# Patient Record
Sex: Female | Born: 1960 | Race: Black or African American | Hispanic: No | Marital: Married | Smoking: Former smoker
Health system: Southern US, Community
[De-identification: ages and names within clinical notes are randomized; demographics above are authoritative.]

## PROBLEM LIST (undated history)

## (undated) DIAGNOSIS — Q765 Cervical rib: Secondary | ICD-10-CM

## (undated) DIAGNOSIS — N390 Urinary tract infection, site not specified: Secondary | ICD-10-CM

---

## 1998-06-26 ENCOUNTER — Encounter: Admission: RE | Admit: 1998-06-26 | Discharge: 1998-09-24 | Payer: Self-pay | Admitting: Anesthesiology

## 1999-06-24 ENCOUNTER — Emergency Department (HOSPITAL_COMMUNITY): Admission: EM | Admit: 1999-06-24 | Discharge: 1999-06-24 | Payer: Self-pay | Admitting: *Deleted

## 2002-05-02 ENCOUNTER — Emergency Department (HOSPITAL_COMMUNITY): Admission: EM | Admit: 2002-05-02 | Discharge: 2002-05-02 | Payer: Self-pay | Admitting: Emergency Medicine

## 2002-05-02 ENCOUNTER — Encounter: Payer: Self-pay | Admitting: Emergency Medicine

## 2002-11-09 ENCOUNTER — Other Ambulatory Visit: Admission: RE | Admit: 2002-11-09 | Discharge: 2002-11-09 | Payer: Self-pay | Admitting: Obstetrics and Gynecology

## 2003-12-08 ENCOUNTER — Emergency Department (HOSPITAL_COMMUNITY): Admission: EM | Admit: 2003-12-08 | Discharge: 2003-12-08 | Payer: Self-pay | Admitting: Emergency Medicine

## 2004-02-26 ENCOUNTER — Emergency Department (HOSPITAL_COMMUNITY): Admission: EM | Admit: 2004-02-26 | Discharge: 2004-02-26 | Payer: Self-pay | Admitting: Emergency Medicine

## 2005-02-19 ENCOUNTER — Emergency Department (HOSPITAL_COMMUNITY): Admission: EM | Admit: 2005-02-19 | Discharge: 2005-02-19 | Payer: Self-pay | Admitting: Emergency Medicine

## 2005-05-28 ENCOUNTER — Emergency Department (HOSPITAL_COMMUNITY): Admission: EM | Admit: 2005-05-28 | Discharge: 2005-05-28 | Payer: Self-pay | Admitting: Emergency Medicine

## 2005-06-08 ENCOUNTER — Ambulatory Visit: Payer: Self-pay | Admitting: *Deleted

## 2005-06-15 ENCOUNTER — Ambulatory Visit: Payer: Self-pay | Admitting: *Deleted

## 2005-06-22 ENCOUNTER — Ambulatory Visit: Payer: Self-pay | Admitting: *Deleted

## 2005-07-13 ENCOUNTER — Ambulatory Visit: Payer: Self-pay | Admitting: *Deleted

## 2005-07-20 ENCOUNTER — Ambulatory Visit: Payer: Self-pay | Admitting: *Deleted

## 2006-11-27 ENCOUNTER — Emergency Department (HOSPITAL_COMMUNITY): Admission: EM | Admit: 2006-11-27 | Discharge: 2006-11-27 | Payer: Self-pay | Admitting: Emergency Medicine

## 2009-03-16 ENCOUNTER — Emergency Department (HOSPITAL_COMMUNITY): Admission: EM | Admit: 2009-03-16 | Discharge: 2009-03-16 | Payer: Self-pay | Admitting: Emergency Medicine

## 2010-05-17 LAB — DIFFERENTIAL
Basophils Absolute: 0.1 10*3/uL (ref 0.0–0.1)
Basophils Relative: 1 % (ref 0–1)
Eosinophils Absolute: 0.2 10*3/uL (ref 0.0–0.7)
Eosinophils Relative: 2 % (ref 0–5)
Lymphocytes Relative: 21 % (ref 12–46)
Lymphs Abs: 2.4 10*3/uL (ref 0.7–4.0)
Monocytes Relative: 4 % (ref 3–12)
Neutro Abs: 8 10*3/uL — ABNORMAL HIGH (ref 1.7–7.7)
Neutrophils Relative %: 72 % (ref 43–77)

## 2010-05-17 LAB — URINALYSIS, ROUTINE W REFLEX MICROSCOPIC
Glucose, UA: NEGATIVE mg/dL
Hgb urine dipstick: NEGATIVE
Specific Gravity, Urine: 1.021 (ref 1.005–1.030)

## 2010-05-17 LAB — COMPREHENSIVE METABOLIC PANEL
Albumin: 4.5 g/dL (ref 3.5–5.2)
Alkaline Phosphatase: 60 U/L (ref 39–117)
BUN: 9 mg/dL (ref 6–23)
GFR calc Af Amer: >60 mL/min (ref >60)
Glucose, Bld: 84 mg/dL (ref 70–99)
Potassium: 4.1 mEq/L (ref 3.5–5.1)
Sodium: 136 mEq/L (ref 135–145)
Total Protein: 8 g/dL (ref 6.0–8.3)

## 2010-05-17 LAB — WET PREP, GENITAL
Clue Cells Wet Prep HPF POC: NONE SEEN
Trich, Wet Prep: NONE SEEN
Yeast Wet Prep HPF POC: NONE SEEN

## 2010-05-17 LAB — CBC
Hemoglobin: 14.4 g/dL (ref 12.0–15.0)
Platelets: 173 10*3/uL (ref 150–400)
RBC: 5.32 MIL/uL — ABNORMAL HIGH (ref 3.87–5.11)

## 2010-05-17 LAB — GC/CHLAMYDIA PROBE AMP, GENITAL: Chlamydia, DNA Probe: NEGATIVE

## 2010-05-17 LAB — LIPASE, BLOOD: Lipase: 26 U/L (ref 11–59)

## 2010-05-17 LAB — POCT PREGNANCY, URINE: Preg Test, Ur: NEGATIVE

## 2010-12-11 LAB — POCT CARDIAC MARKERS
CKMB, poc: <1 — ABNORMAL LOW
CKMB, poc: <1 — ABNORMAL LOW
Myoglobin, poc: 48.7
Operator id: 3206
Troponin i, poc: <0.05
Troponin i, poc: <0.05

## 2010-12-11 LAB — BASIC METABOLIC PANEL
Calcium: 9.5
GFR calc Af Amer: >60
Sodium: 137

## 2012-01-22 ENCOUNTER — Encounter (HOSPITAL_BASED_OUTPATIENT_CLINIC_OR_DEPARTMENT_OTHER): Payer: Self-pay | Admitting: Emergency Medicine

## 2012-01-22 ENCOUNTER — Emergency Department (HOSPITAL_BASED_OUTPATIENT_CLINIC_OR_DEPARTMENT_OTHER): Payer: Self-pay

## 2012-01-22 ENCOUNTER — Emergency Department (HOSPITAL_BASED_OUTPATIENT_CLINIC_OR_DEPARTMENT_OTHER)
Admission: EM | Admit: 2012-01-22 | Discharge: 2012-01-22 | Disposition: A | Discharge status: Home / Self Care | Payer: Self-pay | Attending: Emergency Medicine | Admitting: Emergency Medicine

## 2012-01-22 DIAGNOSIS — R300 Dysuria: Secondary | ICD-10-CM | POA: Insufficient documentation

## 2012-01-22 DIAGNOSIS — N390 Urinary tract infection, site not specified: Secondary | ICD-10-CM | POA: Insufficient documentation

## 2012-01-22 DIAGNOSIS — D219 Benign neoplasm of connective and other soft tissue, unspecified: Secondary | ICD-10-CM

## 2012-01-22 DIAGNOSIS — R110 Nausea: Secondary | ICD-10-CM | POA: Insufficient documentation

## 2012-01-22 DIAGNOSIS — D259 Leiomyoma of uterus, unspecified: Secondary | ICD-10-CM | POA: Insufficient documentation

## 2012-01-22 LAB — COMPREHENSIVE METABOLIC PANEL
ALT: 10 U/L (ref 0–35)
AST: 15 U/L (ref 0–37)
BUN: 12 mg/dL (ref 6–23)
CO2: 24 mEq/L (ref 19–32)
Calcium: 9.5 mg/dL (ref 8.4–10.5)
GFR calc non Af Amer: 74 mL/min — ABNORMAL LOW (ref >90)
Glucose, Bld: 91 mg/dL (ref 70–99)
Sodium: 139 mEq/L (ref 135–145)
Total Bilirubin: 0.2 mg/dL — ABNORMAL LOW (ref 0.3–1.2)

## 2012-01-22 LAB — CBC WITH DIFFERENTIAL/PLATELET
HCT: 39.3 % (ref 36.0–46.0)
Lymphocytes Relative: 19 % (ref 12–46)
MCH: 26.9 pg (ref 26.0–34.0)
MCHC: 34.6 g/dL (ref 30.0–36.0)
MCV: 77.8 fL — ABNORMAL LOW (ref 78.0–100.0)
Monocytes Absolute: 0.5 10*3/uL (ref 0.1–1.0)
Monocytes Relative: 4 % (ref 3–12)
RBC: 5.05 MIL/uL (ref 3.87–5.11)
RDW: 15.1 % (ref 11.5–15.5)

## 2012-01-22 LAB — URINALYSIS, ROUTINE W REFLEX MICROSCOPIC
Bilirubin Urine: NEGATIVE
Glucose, UA: NEGATIVE mg/dL
Protein, ur: NEGATIVE mg/dL
Specific Gravity, Urine: 1.025 (ref 1.005–1.030)
Urobilinogen, UA: 0.2 mg/dL (ref 0.0–1.0)

## 2012-01-22 LAB — URINE MICROSCOPIC-ADD ON

## 2012-01-22 MED ORDER — SULFAMETHOXAZOLE-TRIMETHOPRIM 800-160 MG PO TABS: 1.0000 | ORAL_TABLET | Freq: Two times a day (BID) | ORAL | Status: DC

## 2012-01-22 MED ORDER — IOHEXOL 300 MG/ML  SOLN
25.0000 mL | INTRAMUSCULAR | Status: AC
  Administered 2012-01-22 (×2): 25 mL via ORAL

## 2012-01-22 MED ORDER — ONDANSETRON HCL 4 MG/2ML IJ SOLN
4.0000 mg | Freq: Once | INTRAMUSCULAR | Status: AC
  Administered 2012-01-22: 4 mg via INTRAVENOUS
  Filled 2012-01-22: qty 2

## 2012-01-22 MED ORDER — MORPHINE SULFATE 4 MG/ML IJ SOLN
4.0000 mg | Freq: Once | INTRAMUSCULAR | Status: AC
  Administered 2012-01-22: 4 mg via INTRAVENOUS
  Filled 2012-01-22: qty 1

## 2012-01-22 MED ORDER — IOHEXOL 300 MG/ML  SOLN
100.0000 mL | Freq: Once | INTRAMUSCULAR | Status: AC | PRN
  Administered 2012-01-22: 100 mL via INTRAVENOUS

## 2012-01-22 NOTE — ED Notes (Signed)
Pt c/o sharp left sided abdominal pain, pressure downwards into pelvic area and radiating around to back, lots of pressure when trying to go to the bathroom, extreme pain; upper chest infection x3weeks, abd pain started x1 week, gradually worse, unrelieved with tylenol, no fever, no n/v/d, sts worse when she eats fried food or orange juice.

## 2012-01-22 NOTE — ED Provider Notes (Signed)
History     CSN: [id number]  Arrival date & time 01/22/12  1201   First MD Initiated Contact with Patient 01/22/12 1222      Chief Complaint  Patient presents with  . Abdominal Pain    (Consider location/radiation/quality/duration/timing/severity/associated sxs/prior treatment) HPI Comments: Pt c/o generalized left sided abdominal pian:pt state that the pain is worse with fried food and orange juice:bm normal:pt states that she is having periods every 2 weeks:no fever:pt states that pain is persistent today and is not getting better  Patient is a 51 y.o. female presenting with abdominal pain. The history is provided by the patient. No language interpreter was used.  Abdominal Pain The primary symptoms of the illness include abdominal pain, nausea and dysuria. The primary symptoms of the illness do not include fever, vomiting, diarrhea or vaginal discharge. The current episode started more than 2 days ago. The onset of the illness was gradual. The problem has been gradually worsening.    No past medical history on file.  No past surgical history on file.  No family history on file.  History  Substance Use Topics  . Smoking status: Not on file  . Smokeless tobacco: Not on file  . Alcohol Use: Not on file    OB History    Grav Para Term Preterm Abortions TAB SAB Ect Mult Living                  Review of Systems  Constitutional: Negative for fever.  Respiratory: Negative.   Cardiovascular: Negative.   Gastrointestinal: Positive for nausea and abdominal pain. Negative for vomiting and diarrhea.  Genitourinary: Positive for dysuria. Negative for vaginal discharge.    Allergies  Penicillins  Home Medications  No current outpatient prescriptions on file.  BP 97/68  Pulse 84  Temp 97.9 F (36.6 C) (Oral)  Resp 18  SpO2 100%  Physical Exam  Nursing note and vitals reviewed. Constitutional: She is oriented to person, place, and time. She appears well-nourished.    HENT:  Head: Normocephalic and atraumatic.  Neck: Neck supple.  Cardiovascular: Normal rate and regular rhythm.   Pulmonary/Chest: Effort normal and breath sounds normal.  Abdominal: Soft. Bowel sounds are normal. There is tenderness.  Musculoskeletal: Normal range of motion.  Neurological: She is alert and oriented to person, place, and time.  Skin: Skin is warm and dry.  Psychiatric: She has a normal mood and affect.    ED Course  Procedures (including critical care time)  Labs Reviewed  URINALYSIS, ROUTINE W REFLEX MICROSCOPIC - Abnormal; Notable for the following:    APPearance CLOUDY (*)     Hgb urine dipstick TRACE (*)     Ketones, ur 15 (*)     Leukocytes, UA LARGE (*)     All other components within normal limits  CBC WITH DIFFERENTIAL - Abnormal; Notable for the following:    WBC 10.6 (*)     MCV 77.8 (*)     Neutro Abs 8.0 (*)     All other components within normal limits  COMPREHENSIVE METABOLIC PANEL - Abnormal; Notable for the following:    Total Bilirubin 0.2 (*)     GFR calc non Af Amer 74 (*)     GFR calc Af Amer 86 (*)     All other components within normal limits  URINE MICROSCOPIC-ADD ON - Abnormal; Notable for the following:    Squamous Epithelial / LPF MANY (*)     Bacteria, UA MANY (*)  All other components within normal limits  PREGNANCY, URINE  URINE CULTURE   Ct Abdomen Pelvis W Contrast  01/22/2012  *RADIOLOGY REPORT*  Clinical Data: Abdominal pain  CT ABDOMEN AND PELVIS WITH CONTRAST  Technique:  Multidetector CT imaging of the abdomen and pelvis was performed following the standard protocol during bolus administration of intravenous contrast.  Contrast: OMNIPAQUE IOHEXOL 300 MG/ML  SOLN  Comparison: None.  Findings: 2.9 cm benign-appearing cyst at the liver dome. There is low density surrounding the portal triads in the liver suggesting periportal edema.  Gallbladder, spleen, pancreas are within normal limits.  Common bile duct is upper  limits of normal in caliber.  There is nodularity of the left adrenal gland without focal mass. Right adrenal gland is unremarkable.  Kidneys are within normal limits.  Contrast and fluid filled loops of small and large bowel are noted throughout the abdomen.  No disproportionate dilatation of small bowel.  The appendix is not clearly defined.  The uterine myometrium is heterogeneous with multiple areas of hyper density most compatible with multiple uterine fibroids.  The endometrium is thickened measuring up to 18 mm.  Small cyst in the left adnexa is noted.  Lumbar degenerative disc disease is present at L4-5 and L5-S1 with posterior disc bulging.  There is significant narrowing of the L5 S1 disc.  No vertebral compression deformity.  IMPRESSION: Marked thickening of the endometrial stripe in multiple myometrial masses most compatible with fibroids.  Further characterization with pelvic ultrasound is recommended.  Benign appearing left adnexal cyst.  Periportal edema in the liver.  Correlate with liver function tests.   Original Report Authenticated By: Jolaine Click, M.D.      1. UTI (lower urinary tract infection)   2. Fibroids       MDM  Pt given results of ct:pt is going [facility]:will treat with antibiotics       Teressa Lower, NP 01/22/12 1455

## 2012-01-23 NOTE — ED Provider Notes (Signed)
Medical screening examination/treatment/procedure(s) were performed by non-physician practitioner and as supervising physician I was immediately available for consultation/collaboration.  Debbi Strandberg, MD 01/23/12 1057 

## 2012-01-24 LAB — URINE CULTURE: Colony Count: >=100000

## 2012-01-25 NOTE — ED Notes (Signed)
+   urine  Patient treated appropriately -sensitive to same-chart appended per protocol MD.  

## 2012-02-16 ENCOUNTER — Telehealth: Payer: Self-pay | Admitting: Obstetrics and Gynecology

## 2012-02-16 NOTE — Telephone Encounter (Signed)
Tc to pt regarding msg.  Pt states that she just started her cycle today and it is very light.  Was seen in the ER in 01/2012 and was recommended by the ER MD to see a GYN MD for her fibroids.  Pt advised it is appropriate for her to keep her appt for tomorrow.  Pt voices agreement.

## 2012-02-17 ENCOUNTER — Encounter: Payer: Self-pay | Admitting: Obstetrics and Gynecology

## 2012-03-16 ENCOUNTER — Encounter: Payer: Self-pay | Admitting: Obstetrics and Gynecology

## 2014-07-11 ENCOUNTER — Other Ambulatory Visit: Payer: Self-pay | Admitting: Family Medicine

## 2014-07-11 DIAGNOSIS — N6321 Unspecified lump in the left breast, upper outer quadrant: Secondary | ICD-10-CM

## 2014-07-12 ENCOUNTER — Ambulatory Visit
Admission: RE | Admit: 2014-07-12 | Discharge: 2014-07-12 | Disposition: A | Discharge status: Home / Self Care | Payer: 59 | Source: Ambulatory Visit | Attending: Family Medicine | Admitting: Family Medicine

## 2014-07-12 DIAGNOSIS — N6321 Unspecified lump in the left breast, upper outer quadrant: Secondary | ICD-10-CM

## 2015-05-15 ENCOUNTER — Emergency Department (HOSPITAL_COMMUNITY)
Admission: EM | Admit: 2015-05-15 | Discharge: 2015-05-16 | Disposition: A | Discharge status: Home / Self Care | Payer: BLUE CROSS/BLUE SHIELD | Attending: Emergency Medicine | Admitting: Emergency Medicine

## 2015-05-15 ENCOUNTER — Emergency Department (HOSPITAL_COMMUNITY): Payer: BLUE CROSS/BLUE SHIELD

## 2015-05-15 ENCOUNTER — Encounter (HOSPITAL_COMMUNITY): Payer: Self-pay | Admitting: Emergency Medicine

## 2015-05-15 DIAGNOSIS — Y92410 Unspecified street and highway as the place of occurrence of the external cause: Secondary | ICD-10-CM | POA: Insufficient documentation

## 2015-05-15 DIAGNOSIS — Z880 Allergy status to penicillin: Secondary | ICD-10-CM | POA: Diagnosis not present

## 2015-05-15 DIAGNOSIS — S8001XA Contusion of right knee, initial encounter: Secondary | ICD-10-CM | POA: Insufficient documentation

## 2015-05-15 DIAGNOSIS — Y998 Other external cause status: Secondary | ICD-10-CM | POA: Insufficient documentation

## 2015-05-15 DIAGNOSIS — Y9389 Activity, other specified: Secondary | ICD-10-CM | POA: Diagnosis not present

## 2015-05-15 DIAGNOSIS — Z79899 Other long term (current) drug therapy: Secondary | ICD-10-CM | POA: Insufficient documentation

## 2015-05-15 DIAGNOSIS — K002 Abnormalities of size and form of teeth: Secondary | ICD-10-CM | POA: Diagnosis not present

## 2015-05-15 DIAGNOSIS — S8991XA Unspecified injury of right lower leg, initial encounter: Secondary | ICD-10-CM | POA: Diagnosis present

## 2015-05-15 MED ORDER — CYCLOBENZAPRINE HCL 10 MG PO TABS
10.0000 mg | ORAL_TABLET | Freq: Once | ORAL | Status: AC
  Administered 2015-05-16: 10 mg via ORAL
  Filled 2015-05-15: qty 1

## 2015-05-15 MED ORDER — CYCLOBENZAPRINE HCL 10 MG PO TABS: 10.0000 mg | ORAL_TABLET | Freq: Two times a day (BID) | ORAL | Status: DC | PRN

## 2015-05-15 MED ORDER — TRAMADOL HCL 50 MG PO TABS
50.0000 mg | ORAL_TABLET | Freq: Once | ORAL | Status: AC
  Administered 2015-05-16: 50 mg via ORAL
  Filled 2015-05-15: qty 1

## 2015-05-15 MED ORDER — IBUPROFEN 800 MG PO TABS: 800.0000 mg | ORAL_TABLET | Freq: Three times a day (TID) | ORAL | Status: DC

## 2015-05-15 MED ORDER — TRAMADOL HCL 50 MG PO TABS: 50.0000 mg | ORAL_TABLET | Freq: Two times a day (BID) | ORAL | Status: DC | PRN

## 2015-05-15 MED ORDER — IBUPROFEN 800 MG PO TABS
800.0000 mg | ORAL_TABLET | Freq: Once | ORAL | Status: AC
  Administered 2015-05-16: 800 mg via ORAL
  Filled 2015-05-15: qty 1
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
## 2015-05-16 DIAGNOSIS — S8001XA Contusion of right knee, initial encounter: Secondary | ICD-10-CM | POA: Diagnosis not present

## 2016-01-26 ENCOUNTER — Encounter (HOSPITAL_COMMUNITY): Payer: Self-pay

## 2016-01-26 ENCOUNTER — Emergency Department (HOSPITAL_COMMUNITY)
Admission: EM | Admit: 2016-01-26 | Discharge: 2016-01-26 | Disposition: A | Discharge status: Home / Self Care | Payer: BLUE CROSS/BLUE SHIELD | Attending: Emergency Medicine | Admitting: Emergency Medicine

## 2016-01-26 DIAGNOSIS — N3000 Acute cystitis without hematuria: Secondary | ICD-10-CM | POA: Insufficient documentation

## 2016-01-26 DIAGNOSIS — F17290 Nicotine dependence, other tobacco product, uncomplicated: Secondary | ICD-10-CM | POA: Insufficient documentation

## 2016-01-26 HISTORY — DX: Urinary tract infection, site not specified: N39.0

## 2016-01-26 LAB — URINALYSIS, ROUTINE W REFLEX MICROSCOPIC
Bilirubin Urine: NEGATIVE
Glucose, UA: NEGATIVE mg/dL
Hgb urine dipstick: NEGATIVE
Ketones, ur: NEGATIVE mg/dL
NITRITE: NEGATIVE
PH: 5.5 (ref 5.0–8.0)
Protein, ur: NEGATIVE mg/dL
SPECIFIC GRAVITY, URINE: 1.019 (ref 1.005–1.030)

## 2016-01-26 LAB — URINE MICROSCOPIC-ADD ON

## 2016-01-26 LAB — POC URINE PREG, ED: PREG TEST UR: NEGATIVE

## 2016-01-26 MED ORDER — CEPHALEXIN 500 MG PO CAPS: 500.0000 mg | ORAL_CAPSULE | Freq: Two times a day (BID) | ORAL | 0 refills | Status: DC

## 2016-01-26 NOTE — ED Triage Notes (Signed)
PT C/O URINARY FREQUENCY, ODOR, AND DYSURIA X1 WEEK. PT DENIES FEVER.

## 2016-01-26 NOTE — ED Provider Notes (Signed)
[facility] DEPT Provider Note   CSN: [id number] Arrival date & time: 01/26/16  2103     History   Chief Complaint Chief Complaint  Patient presents with  . Dysuria    HPI Andrea Snyder is a 55 y.o. female.  Patient presents to the emergency department with chief complaint of dysuria. She states the symptoms started about a week ago. She states that this feels like UTI. She states that she gets a UTI about once every 2 years. She states this feels characteristic of her UTIs. She states that she began drinking a lot of water and cranberry juice, but knew that she had to come and seek treatment when she began to get pain in her low back. She reports associated dysuria, frequency, and urgency. She denies any hematuria. She denies any fevers, chills, nausea, or vomiting. She states that she has taken Keflex and Cipro in the past both with good results. However, she would like to try the Keflex, because she got a yeast infection with the Cipro.    Dysuria      Past Medical History:  Diagnosis Date  . UTI (urinary tract infection)     There are no active problems to display for this patient.   History reviewed. No pertinent surgical history.  OB History    No data available       Home Medications    Prior to Admission medications   Medication Sig Start Date End Date Taking? Authorizing Provider  cyclobenzaprine (FLEXERIL) 10 MG tablet Take 1 tablet (10 mg total) by mouth 2 (two) times daily as needed for muscle spasms. 05/15/15   Delsa Grana, PA-C  ibuprofen (ADVIL,MOTRIN) 800 MG tablet Take 1 tablet (800 mg total) by mouth 3 (three) times daily. 05/15/15   Delsa Grana, PA-C  Specialty Vitamins Products (WEIGHT LOSS DAILY MULTI) TABS Take 1 tablet by mouth 2 (two) times daily.    Historical Provider, MD  sulfamethoxazole-trimethoprim (SEPTRA DS) 800-160 MG per tablet Take 1 tablet by mouth every 12 (twelve) hours. Patient not taking: Reported on 05/15/2015 01/22/12    Glendell Docker, NP  traMADol (ULTRAM) 50 MG tablet Take 1 tablet (50 mg total) by mouth every 12 (twelve) hours as needed for severe pain. 05/15/15   Delsa Grana, PA-C    Family History History reviewed. No pertinent family history.  Social History Social History  Substance Use Topics  . Smoking status: Current Some Day Smoker    Packs/day: 0.50    Types: Cigars  . Smokeless tobacco: Never Used  . Alcohol use Yes     Comment: OCCASIONAL     Allergies   Penicillins and Percocet [oxycodone-acetaminophen]   Review of Systems Review of Systems  Genitourinary: Positive for dysuria.  All other systems reviewed and are negative.    Physical Exam Updated Vital Signs BP 127/55 (BP Location: Right Arm)   Pulse 62   Temp 98.6 F (37 C) (Oral)   Resp 16   Ht 5\' 6"  (1.676 m)   Wt 65.8 kg   LMP 01/11/2012   SpO2 99%   BMI 23.40 kg/m   Physical Exam  Constitutional: She is oriented to person, place, and time. She appears well-developed and well-nourished.  HENT:  Head: Normocephalic and atraumatic.  Eyes: Conjunctivae and EOM are normal.  Neck: Normal range of motion.  Cardiovascular: Normal rate.   Pulmonary/Chest: Effort normal.  Abdominal: She exhibits no distension.  Musculoskeletal: Normal range of motion.  Neurological: She is alert and  oriented to person, place, and time.  Skin: Skin is dry.  Psychiatric: She has a normal mood and affect. Her behavior is normal. Judgment and thought content normal.  Nursing note and vitals reviewed.    ED Treatments / Results  Labs (all labs ordered are listed, but only abnormal results are displayed) Labs Reviewed  URINE CULTURE  URINALYSIS, ROUTINE W REFLEX MICROSCOPIC (NOT AT [facility] [place])  POC URINE PREG, ED    EKG  EKG Interpretation None       Radiology No results found.  Procedures Procedures (including critical care time)  Medications Ordered in ED Medications - No data to display   Initial Impression /  Assessment and Plan / ED Course  I have reviewed the triage vital signs and the nursing notes.  Pertinent labs & imaging results that were available during my care of the patient were reviewed by me and considered in my medical decision making (see chart for details).  Clinical Course     Patient with symptoms consistent with UTI. Will treat with Keflex. Will send urine for culture. Recommend primary care follow-up. Return precautions discussed. Patient understands and agrees the plan. She is stable and ready for discharge.  Final Clinical Impressions(s) / ED Diagnoses   Final diagnoses:  Acute cystitis without hematuria    New Prescriptions New Prescriptions   CEPHALEXIN (KEFLEX) 500 MG CAPSULE    Take 1 capsule (500 mg total) by mouth 2 (two) times daily.     Montine Circle, PA-C 01/26/16 [place], MD 01/27/16 [phone]

## 2016-01-29 LAB — URINE CULTURE

## 2016-01-30 ENCOUNTER — Telehealth (HOSPITAL_BASED_OUTPATIENT_CLINIC_OR_DEPARTMENT_OTHER): Payer: Self-pay | Admitting: Emergency Medicine

## 2016-01-30 NOTE — Telephone Encounter (Signed)
Post ED Visit - Positive Culture Follow-up  Culture report reviewed by antimicrobial stewardship pharmacist:  []  Elenor Quinones, Pharm.D. []  Heide Guile, Pharm.D., BCPS []  Parks Neptune, Pharm.D. []  Alycia Rossetti, Pharm.D., BCPS []  [place], [place].D., BCPS, AAHIVP []  Legrand Como, Pharm.D., BCPS, AAHIVP []  Milus Glazier, Pharm.D. []  Stephens November, Pharm.D. Apryl Ouida Sills PharmD  Positive urine culture Treated with cephalexin, organism sensitive to the same and no further patient follow-up is required at this time.  [name] 01/30/2016, 12:14 PM

## 2017-09-30 ENCOUNTER — Encounter (HOSPITAL_COMMUNITY): Payer: Self-pay | Admitting: Emergency Medicine

## 2017-09-30 ENCOUNTER — Emergency Department (HOSPITAL_COMMUNITY)
Admission: EM | Admit: 2017-09-30 | Discharge: 2017-09-30 | Disposition: A | Discharge status: Home / Self Care | Payer: BLUE CROSS/BLUE SHIELD | Attending: Emergency Medicine | Admitting: Emergency Medicine

## 2017-09-30 DIAGNOSIS — Z79899 Other long term (current) drug therapy: Secondary | ICD-10-CM | POA: Insufficient documentation

## 2017-09-30 DIAGNOSIS — N3001 Acute cystitis with hematuria: Secondary | ICD-10-CM | POA: Insufficient documentation

## 2017-09-30 DIAGNOSIS — F17290 Nicotine dependence, other tobacco product, uncomplicated: Secondary | ICD-10-CM | POA: Insufficient documentation

## 2017-09-30 LAB — URINALYSIS, ROUTINE W REFLEX MICROSCOPIC
Bilirubin Urine: NEGATIVE
GLUCOSE, UA: NEGATIVE mg/dL
Ketones, ur: NEGATIVE mg/dL
Nitrite: POSITIVE — AB
PH: 5 (ref 5.0–8.0)
Protein, ur: NEGATIVE mg/dL
Specific Gravity, Urine: 1.023 (ref 1.005–1.030)
WBC, UA: >50 WBC/hpf — ABNORMAL HIGH (ref 0–5)

## 2017-09-30 LAB — POC URINE PREG, ED: Preg Test, Ur: NEGATIVE

## 2017-09-30 MED ORDER — CEPHALEXIN 500 MG PO CAPS: 500.0000 mg | ORAL_CAPSULE | Freq: Four times a day (QID) | ORAL | 0 refills | Status: DC

## 2017-09-30 NOTE — ED Provider Notes (Signed)
[facility] DEPT Provider Note   CSN: [id number] Arrival date & time: 09/30/17  1832     History   Chief Complaint Chief Complaint  Patient presents with  . Dysuria  . Urinary Frequency    HPI Andrea Snyder is a 57 y.o. female with history of recurrent UTI is here for evaluation of lower abdominal pain since Sunday.  Described as sharp and fullness.  Associated with urinary frequency, darker urine, and bilateral low back pain.  States she is pretty sure that she has another urinary tract infection as all of the symptoms are pretty typical of her UTIs.  She feels more tired but denies fevers, chills, nausea, vomiting, flank pain, dysuria, hematuria, abnormal vaginal discharge, changes in bowel movements.Marland Kitchen  LMP at age 43.  She is not concerned about STDs.  No history of abdominal surgeries.  HPI  Past Medical History:  Diagnosis Date  . UTI (urinary tract infection)     There are no active problems to display for this patient.   History reviewed. No pertinent surgical history.   OB History   None      Home Medications    Prior to Admission medications   Medication Sig Start Date End Date Taking? Authorizing Provider  cephALEXin (KEFLEX) 500 MG capsule Take 1 capsule (500 mg total) by mouth 4 (four) times daily. 09/30/17   Kinnie Feil, PA-C  cyclobenzaprine (FLEXERIL) 10 MG tablet Take 1 tablet (10 mg total) by mouth 2 (two) times daily as needed for muscle spasms. 05/15/15   Delsa Grana, PA-C  ibuprofen (ADVIL,MOTRIN) 800 MG tablet Take 1 tablet (800 mg total) by mouth 3 (three) times daily. 05/15/15   Delsa Grana, PA-C  Specialty Vitamins Products (WEIGHT LOSS DAILY MULTI) TABS Take 1 tablet by mouth 2 (two) times daily.    [provider]  sulfamethoxazole-trimethoprim (SEPTRA DS) 800-160 MG per tablet Take 1 tablet by mouth every 12 (twelve) hours. Patient not taking: Reported on 05/15/2015 01/22/12   Glendell Docker, NP    traMADol (ULTRAM) 50 MG tablet Take 1 tablet (50 mg total) by mouth every 12 (twelve) hours as needed for severe pain. 05/15/15   Delsa Grana, PA-C    Family History No family history on file.  Social History Social History   Tobacco Use  . Smoking status: Current Some Day Smoker    Packs/day: 0.50    Types: Cigars  . Smokeless tobacco: Never Used  Substance Use Topics  . Alcohol use: Yes    Comment: OCCASIONAL  . Drug use: No     Allergies   Penicillins and Percocet [oxycodone-acetaminophen]   Review of Systems Review of Systems  Genitourinary: Positive for pelvic pain and urgency.  Musculoskeletal: Positive for back pain.  All other systems reviewed and are negative.    Physical Exam Updated Vital Signs BP (!) 126/94 (BP Location: Left Arm)   Pulse 77   Temp 98.3 F (36.8 C) (Oral)   Resp 18   LMP 01/11/2012   SpO2 99%   Physical Exam  Constitutional: She is oriented to person, place, and time. She appears well-developed and well-nourished. No distress.  Non toxic  HENT:  Head: Normocephalic and atraumatic.  Nose: Nose normal.  Moist mucous membranes   Eyes: Pupils are equal, round, and reactive to light. Conjunctivae and EOM are normal.  Neck: Normal range of motion.  Cardiovascular: Normal rate, regular rhythm and intact distal pulses.  Pulmonary/Chest: Effort normal and breath sounds  normal.  Abdominal: Soft. Bowel sounds are normal. There is no tenderness.  No G/R/R. No suprapubic or CVA tenderness. Negative Murphy's and McBurney's.  No distention.  Active bowel sounds to lower quadrants.  Musculoskeletal: Normal range of motion.  Neurological: She is alert and oriented to person, place, and time.  Skin: Skin is warm and dry. Capillary refill takes less than 2 seconds.  Psychiatric: She has a normal mood and affect. Her behavior is normal.  Nursing note and vitals reviewed.    ED Treatments / Results  Labs (all labs ordered are listed, but  only abnormal results are displayed) Labs Reviewed  URINALYSIS, ROUTINE W REFLEX MICROSCOPIC - Abnormal; Notable for the following components:      Result Value   APPearance HAZY (*)    Hgb urine dipstick SMALL (*)    Nitrite POSITIVE (*)    Leukocytes, UA MODERATE (*)    WBC, UA >50 (*)    Bacteria, UA MANY (*)    All other components within normal limits  URINE CULTURE  POC URINE PREG, ED    EKG None  Radiology No results found.  Procedures Procedures (including critical care time)  Medications Ordered in ED Medications - No data to display   Initial Impression / Assessment and Plan / ED Course  I have reviewed the triage vital signs and the nursing notes.  Pertinent labs & imaging results that were available during my care of the patient were reviewed by me and considered in my medical decision making (see chart for details).     Urine with positive nitrites, moderate leuks, >50 WBC, many bacteria which fits patient's typical UTI symptoms. abd exam benign as above. No signs of pyelonephritis. No h/o kidney stones. No SIRS criteria met. She is well appearing.  Will dc with keflex, tylenol for associated pain. Discussed return precautions.   Final Clinical Impressions(s) / ED Diagnoses   Final diagnoses:  Acute cystitis with hematuria    ED Discharge Orders        Ordered    cephALEXin (KEFLEX) 500 MG capsule  4 times daily     09/30/17 2102       Kinnie Feil, PA-C 09/30/17 2310    Fredia Sorrow, MD 10/01/17 1710

## 2017-09-30 NOTE — Discharge Instructions (Signed)
Return to the ER for worsening pain, fevers, chills, blood in your urine, flank pain, nausea, vomiting

## 2017-09-30 NOTE — ED Triage Notes (Signed)
Patient c/o dysuria, urinary frequency, lower abdominal pain and lower back pain x6 days. Hx UTI.

## 2017-10-03 LAB — URINE CULTURE: Culture: >=100000 — AB

## 2017-10-04 ENCOUNTER — Telehealth: Payer: Self-pay | Admitting: Emergency Medicine

## 2017-10-04 NOTE — Telephone Encounter (Signed)
Post ED Visit - Positive Culture Follow-up  Culture report reviewed by antimicrobial stewardship pharmacist:  []  Elenor Quinones, Pharm.D. []  Heide Guile, Pharm.D., BCPS AQ-ID []  Parks Neptune, Pharm.D., BCPS []  Alycia Rossetti, Pharm.D., BCPS []  [place], Pharm.D., BCPS, AAHIVP []  Legrand Como, Pharm.D., BCPS, AAHIVP []  Salome Arnt, PharmD, BCPS []  Johnnette Gourd, PharmD, BCPS []  Hughes Better, PharmD, BCPS []  Leeroy Cha, PharmD Jackson Latino PharmD  Positive urine culture Treated with Cephalexin, organism sensitive to the same and no further patient follow-up is required at this time.  [name] 10/04/2017, 10:17 AM

## 2017-11-19 ENCOUNTER — Emergency Department (HOSPITAL_BASED_OUTPATIENT_CLINIC_OR_DEPARTMENT_OTHER)
Admission: EM | Admit: 2017-11-19 | Discharge: 2017-11-19 | Disposition: A | Discharge status: Home / Self Care | Payer: No Typology Code available for payment source | Attending: Emergency Medicine | Admitting: Emergency Medicine

## 2017-11-19 ENCOUNTER — Encounter (HOSPITAL_BASED_OUTPATIENT_CLINIC_OR_DEPARTMENT_OTHER): Payer: Self-pay

## 2017-11-19 ENCOUNTER — Other Ambulatory Visit: Payer: Self-pay

## 2017-11-19 ENCOUNTER — Emergency Department (HOSPITAL_BASED_OUTPATIENT_CLINIC_OR_DEPARTMENT_OTHER): Payer: No Typology Code available for payment source

## 2017-11-19 DIAGNOSIS — Q765 Cervical rib: Secondary | ICD-10-CM | POA: Diagnosis not present

## 2017-11-19 DIAGNOSIS — M79622 Pain in left upper arm: Secondary | ICD-10-CM | POA: Diagnosis not present

## 2017-11-19 DIAGNOSIS — F17210 Nicotine dependence, cigarettes, uncomplicated: Secondary | ICD-10-CM | POA: Diagnosis not present

## 2017-11-19 DIAGNOSIS — M542 Cervicalgia: Secondary | ICD-10-CM | POA: Diagnosis not present

## 2017-11-19 DIAGNOSIS — M546 Pain in thoracic spine: Secondary | ICD-10-CM | POA: Diagnosis not present

## 2017-11-19 HISTORY — DX: Cervical rib: Q76.5

## 2017-11-19 MED ORDER — IBUPROFEN 800 MG PO TABS: 800.0000 mg | ORAL_TABLET | Freq: Three times a day (TID) | ORAL | 0 refills | Status: DC | PRN

## 2017-11-19 MED ORDER — CYCLOBENZAPRINE HCL 10 MG PO TABS: 10.0000 mg | ORAL_TABLET | Freq: Three times a day (TID) | ORAL | 0 refills | Status: DC | PRN

## 2017-11-19 MED ORDER — CYCLOBENZAPRINE HCL 10 MG PO TABS
10.0000 mg | ORAL_TABLET | Freq: Once | ORAL | Status: AC
  Administered 2017-11-19: 10 mg via ORAL
  Filled 2017-11-19: qty 1

## 2017-11-19 MED ORDER — IBUPROFEN 800 MG PO TABS
800.0000 mg | ORAL_TABLET | Freq: Once | ORAL | Status: AC
  Administered 2017-11-19: 800 mg via ORAL
  Filled 2017-11-19: qty 1
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
## 2018-01-19 ENCOUNTER — Emergency Department (HOSPITAL_BASED_OUTPATIENT_CLINIC_OR_DEPARTMENT_OTHER)
Admission: EM | Admit: 2018-01-19 | Discharge: 2018-01-20 | Disposition: A | Discharge status: Home / Self Care | Payer: Self-pay | Attending: Emergency Medicine | Admitting: Emergency Medicine

## 2018-01-19 ENCOUNTER — Encounter (HOSPITAL_BASED_OUTPATIENT_CLINIC_OR_DEPARTMENT_OTHER): Payer: Self-pay | Admitting: *Deleted

## 2018-01-19 ENCOUNTER — Other Ambulatory Visit: Payer: Self-pay

## 2018-01-19 DIAGNOSIS — Z79899 Other long term (current) drug therapy: Secondary | ICD-10-CM | POA: Insufficient documentation

## 2018-01-19 DIAGNOSIS — F17290 Nicotine dependence, other tobacco product, uncomplicated: Secondary | ICD-10-CM | POA: Insufficient documentation

## 2018-01-19 DIAGNOSIS — N760 Acute vaginitis: Secondary | ICD-10-CM | POA: Insufficient documentation

## 2018-01-19 DIAGNOSIS — A599 Trichomoniasis, unspecified: Secondary | ICD-10-CM | POA: Insufficient documentation

## 2018-01-19 DIAGNOSIS — B9689 Other specified bacterial agents as the cause of diseases classified elsewhere: Secondary | ICD-10-CM

## 2018-01-19 LAB — URINALYSIS, MICROSCOPIC (REFLEX)

## 2018-01-19 LAB — URINALYSIS, ROUTINE W REFLEX MICROSCOPIC
Bilirubin Urine: NEGATIVE
GLUCOSE, UA: NEGATIVE mg/dL
HGB URINE DIPSTICK: NEGATIVE
Ketones, ur: NEGATIVE mg/dL
Nitrite: NEGATIVE
PH: 6 (ref 5.0–8.0)
PROTEIN: NEGATIVE mg/dL

## 2018-01-19 LAB — PREGNANCY, URINE: Preg Test, Ur: NEGATIVE

## 2018-01-19 LAB — WET PREP, GENITAL
SPERM: NONE SEEN
YEAST WET PREP: NONE SEEN

## 2018-01-19 NOTE — ED Triage Notes (Signed)
Pt c/o vaginal discharge x 10 days no relief with OTC

## 2018-01-20 MED ORDER — METRONIDAZOLE 500 MG PO TABS: 500.0000 mg | ORAL_TABLET | Freq: Two times a day (BID) | ORAL | 0 refills | Status: DC

## 2018-01-20 MED ORDER — CEFTRIAXONE SODIUM 250 MG IJ SOLR
250.0000 mg | Freq: Once | INTRAMUSCULAR | Status: AC
  Administered 2018-01-20: 250 mg via INTRAMUSCULAR
  Filled 2018-01-20: qty 250

## 2018-01-20 MED ORDER — METRONIDAZOLE 500 MG PO TABS
2000.0000 mg | ORAL_TABLET | Freq: Once | ORAL | Status: AC
  Administered 2018-01-20: 2000 mg via ORAL
  Filled 2018-01-20: qty 4

## 2018-01-20 MED ORDER — AZITHROMYCIN 250 MG PO TABS
1000.0000 mg | ORAL_TABLET | Freq: Once | ORAL | Status: AC
  Administered 2018-01-20: 1000 mg via ORAL
  Filled 2018-01-20: qty 4

## 2018-01-20 NOTE — Discharge Instructions (Addendum)
You have been treated today for trichomonas, gonorrhea and chlamydia.  I recommend close follow-up with your primary care physician for testing for HIV, syphilis, hepatitis as these also can be sexually transmitted diseases.  Your partner will need to be tested and treated as well.  We recommend no sexual activity for at least 1 week after treatment has completed.  Do not take alcohol while taking metronidazole.

## 2018-01-20 NOTE — ED Provider Notes (Signed)
TIME SEEN: 11:35 PM  CHIEF COMPLAINT: Vaginal discharge  HPI: Patient is a 57 year old female with no significant past medical history who presents to the emergency department with over 1 week of thick, white, foul-smelling vaginal discharge.  She states that she tried 3 days of over-the-counter Monistat without relief.  No itching or burning.  Denies fevers, abdominal pain, vomiting, diarrhea, dysuria.  Is sexually active with one female partner and does not use condoms.  No history of STDs.  Last menstrual period was in 2013.  ROS: See HPI Constitutional: no fever  Eyes: no drainage  ENT: no runny nose   Cardiovascular:  no chest pain  Resp: no SOB  GI: no vomiting GU: no dysuria Integumentary: no rash  Allergy: no hives  Musculoskeletal: no leg swelling  Neurological: no slurred speech ROS otherwise negative  PAST MEDICAL HISTORY/PAST SURGICAL HISTORY:  Past Medical History:  Diagnosis Date  . Cervical rib   . UTI (urinary tract infection)     MEDICATIONS:  Prior to Admission medications   Medication Sig Start Date End Date Taking? Authorizing Provider  ibuprofen (ADVIL,MOTRIN) 800 MG tablet Take 1 tablet (800 mg total) by mouth every 8 (eight) hours as needed (for pain). 11/19/17   Molpus, John, MD  Specialty Vitamins Products (WEIGHT LOSS DAILY MULTI) TABS Take 1 tablet by mouth 2 (two) times daily.    [provider]    ALLERGIES:  Allergies  Allergen Reactions  . Penicillins Itching  . Percocet [Oxycodone-Acetaminophen] Itching and Nausea And Vomiting    SOCIAL HISTORY:  Social History   Tobacco Use  . Smoking status: Current Some Day Smoker    Packs/day: 0.50    Types: Cigars  . Smokeless tobacco: Never Used  Substance Use Topics  . Alcohol use: Yes    Comment: OCCASIONAL    FAMILY HISTORY: History reviewed. No pertinent family history.  EXAM: BP 122/73 (BP Location: Right Arm)   Pulse 88   Temp 98.2 F (36.8 C) (Oral)   Resp 16   Ht 5\' 6"   (1.676 m)   Wt 58.3 kg   LMP 01/11/2012   SpO2 100%   BMI 20.74 kg/m  CONSTITUTIONAL: Alert and oriented and responds appropriately to questions. Well-appearing; well-nourished HEAD: Normocephalic EYES: Conjunctivae clear, pupils appear equal, EOMI ENT: normal nose; moist mucous membranes NECK: Supple, no meningismus, no nuchal rigidity, no LAD  CARD: RRR; S1 and S2 appreciated; no murmurs, no clicks, no rubs, no gallops RESP: Normal chest excursion without splinting or tachypnea; breath sounds clear and equal bilaterally; no wheezes, no rhonchi, no rales, no hypoxia or respiratory distress, speaking full sentences ABD/GI: Normal bowel sounds; non-distended; soft, non-tender, no rebound, no guarding, no peritoneal signs, no hepatosplenomegaly GU:  Normal external genitalia. No lesions, rashes noted. Patient has no vaginal bleeding on exam.  Copious amount of thin yellow foul-smelling vaginal discharge.  No adnexal tenderness, mass or fullness, no cervical motion tenderness. Cervix is not appear friable.  Cervix is closed.  Chaperone present for exam. BACK:  The back appears normal and is non-tender to palpation, there is no CVA tenderness EXT: Normal ROM in all joints; non-tender to palpation; no edema; normal capillary refill; no cyanosis, no calf tenderness or swelling    SKIN: Normal color for age and race; warm; no rash NEURO: Moves all extremities equally PSYCH: The patient's mood and manner are appropriate. Grooming and personal hygiene are appropriate.  MEDICAL DECISION MAKING: Patient here with vaginal discharge.  Wet prep positive  for clue cells as well as trichomonas.  No yeast.  Gonorrhea and Chlamydia cultures pending.  Urine does show small leukocytes and many bacteria but likely from her vaginal discharge.  She is not having urinary symptoms and I doubt UTI today.  Discussed with patient that this is a sexually transmitted disease and her partner will need to be tested and treated  as well.  Will give 2 g of oral Flagyl, also treat empirically for gonorrhea and chlamydia today.  Have offered her HIV and syphilis testing which she refuses stating that she will get this done her primary care doctor's office.  Will discharge with Flagyl for the next week for her bacterial vaginosis as well.  At this time, I do not feel there is any life-threatening condition present. I have reviewed and discussed all results (EKG, imaging, lab, urine as appropriate) and exam findings with patient/family. I have reviewed nursing notes and appropriate previous records.  I feel the patient is safe to be discharged home without further emergent workup and can continue workup as an outpatient as needed. Discussed usual and customary return precautions. Patient/family verbalize understanding and are comfortable with this plan.  Outpatient follow-up has been provided if needed. All questions have been answered.       Kevionna Heffler, Delice Bison, DO 01/20/18 [phone]

## 2018-01-21 LAB — GC/CHLAMYDIA PROBE AMP (~~LOC~~) NOT AT ARMC
CHLAMYDIA, DNA PROBE: NEGATIVE
Neisseria Gonorrhea: NEGATIVE

## 2018-10-12 ENCOUNTER — Emergency Department (HOSPITAL_COMMUNITY)
Admission: EM | Admit: 2018-10-12 | Discharge: 2018-10-12 | Disposition: A | Discharge status: Home / Self Care | Payer: No Typology Code available for payment source | Attending: Emergency Medicine | Admitting: Emergency Medicine

## 2018-10-12 ENCOUNTER — Other Ambulatory Visit: Payer: Self-pay

## 2018-10-12 ENCOUNTER — Emergency Department (HOSPITAL_COMMUNITY): Payer: No Typology Code available for payment source

## 2018-10-12 ENCOUNTER — Encounter (HOSPITAL_COMMUNITY): Payer: Self-pay

## 2018-10-12 DIAGNOSIS — S0990XA Unspecified injury of head, initial encounter: Secondary | ICD-10-CM | POA: Diagnosis present

## 2018-10-12 DIAGNOSIS — S060X0A Concussion without loss of consciousness, initial encounter: Secondary | ICD-10-CM | POA: Diagnosis not present

## 2018-10-12 DIAGNOSIS — Y93I9 Activity, other involving external motion: Secondary | ICD-10-CM | POA: Diagnosis not present

## 2018-10-12 DIAGNOSIS — Y999 Unspecified external cause status: Secondary | ICD-10-CM | POA: Insufficient documentation

## 2018-10-12 DIAGNOSIS — Y92481 Parking lot as the place of occurrence of the external cause: Secondary | ICD-10-CM | POA: Insufficient documentation

## 2018-10-12 DIAGNOSIS — F17210 Nicotine dependence, cigarettes, uncomplicated: Secondary | ICD-10-CM | POA: Diagnosis not present

## 2018-10-12 MED ORDER — NAPROXEN 500 MG PO TABS
500.0000 mg | ORAL_TABLET | Freq: Once | ORAL | Status: AC
  Administered 2018-10-12: 500 mg via ORAL
  Filled 2018-10-12: qty 1

## 2018-10-12 MED ORDER — IBUPROFEN 400 MG PO TABS: 400.0000 mg | ORAL_TABLET | Freq: Four times a day (QID) | ORAL | 0 refills | Status: AC | PRN

## 2018-10-12 NOTE — ED Notes (Signed)
Pt called from lobby x1 No response
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
## 2018-10-12 NOTE — Discharge Instructions (Addendum)
Your seen today for headache and dizziness. You may have suffered a mild concussion.   Make sure that you rest and decreased stimulation.  Take ibuprofen as needed for pain or discomfort.  Follow-up with your primary physician if not improving.
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
## 2018-10-12 NOTE — ED Notes (Signed)
Patient transported to CT 

## 2018-11-09 ENCOUNTER — Encounter: Payer: Self-pay | Admitting: Neurology
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
## 2018-12-08 ENCOUNTER — Ambulatory Visit (INDEPENDENT_AMBULATORY_CARE_PROVIDER_SITE_OTHER): Payer: No Typology Code available for payment source | Admitting: Neurology

## 2018-12-08 ENCOUNTER — Encounter: Payer: Self-pay | Admitting: Neurology

## 2018-12-08 ENCOUNTER — Other Ambulatory Visit: Payer: Self-pay

## 2018-12-08 VITALS — BP 128/81 | HR 86 | Temp 97.0°F | Ht 66.5 in | Wt 120.0 lb

## 2018-12-08 DIAGNOSIS — H9312 Tinnitus, left ear: Secondary | ICD-10-CM | POA: Diagnosis not present

## 2018-12-08 DIAGNOSIS — G44309 Post-traumatic headache, unspecified, not intractable: Secondary | ICD-10-CM | POA: Diagnosis not present

## 2018-12-08 DIAGNOSIS — F0781 Postconcussional syndrome: Secondary | ICD-10-CM

## 2018-12-08 MED ORDER — NORTRIPTYLINE HCL 10 MG PO CAPS: 10.0000 mg | ORAL_CAPSULE | Freq: Every day | ORAL | 3 refills | Status: DC

## 2018-12-08 NOTE — Patient Instructions (Signed)
1.  Start nortriptyline 10mg  at bedtime.  If headaches not improved in 4 weeks, then contact me and I will increase dose. 2.  Use ibuprofen as needed but limit use of pain relievers to no more than 2 days out of week to prevent risk of rebound or medication-overuse headache. 3.  Follow up in 4 months.

## 2018-12-09 ENCOUNTER — Encounter: Payer: Self-pay | Admitting: Neurology

## 2019-01-31 ENCOUNTER — Other Ambulatory Visit: Payer: Self-pay

## 2019-01-31 DIAGNOSIS — Z20822 Contact with and (suspected) exposure to covid-19: Secondary | ICD-10-CM

## 2019-02-02 LAB — NOVEL CORONAVIRUS, NAA: SARS-CoV-2, NAA: NOT DETECTED

## 2019-04-14 ENCOUNTER — Ambulatory Visit: Payer: No Typology Code available for payment source | Admitting: Neurology
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
## 2019-04-20 ENCOUNTER — Telehealth: Payer: No Typology Code available for payment source | Admitting: Neurology

## 2019-04-20 ENCOUNTER — Other Ambulatory Visit: Payer: Self-pay

## 2019-06-23 ENCOUNTER — Emergency Department (HOSPITAL_BASED_OUTPATIENT_CLINIC_OR_DEPARTMENT_OTHER): Payer: Self-pay

## 2019-06-23 ENCOUNTER — Encounter (HOSPITAL_BASED_OUTPATIENT_CLINIC_OR_DEPARTMENT_OTHER): Payer: Self-pay

## 2019-06-23 ENCOUNTER — Emergency Department (HOSPITAL_BASED_OUTPATIENT_CLINIC_OR_DEPARTMENT_OTHER)
Admission: EM | Admit: 2019-06-23 | Discharge: 2019-06-23 | Disposition: A | Discharge status: Home / Self Care | Payer: Self-pay | Attending: Emergency Medicine | Admitting: Emergency Medicine

## 2019-06-23 ENCOUNTER — Other Ambulatory Visit: Payer: Self-pay

## 2019-06-23 DIAGNOSIS — R1011 Right upper quadrant pain: Secondary | ICD-10-CM | POA: Insufficient documentation

## 2019-06-23 DIAGNOSIS — F17210 Nicotine dependence, cigarettes, uncomplicated: Secondary | ICD-10-CM | POA: Insufficient documentation

## 2019-06-23 DIAGNOSIS — Z885 Allergy status to narcotic agent status: Secondary | ICD-10-CM | POA: Insufficient documentation

## 2019-06-23 DIAGNOSIS — R10811 Right upper quadrant abdominal tenderness: Secondary | ICD-10-CM

## 2019-06-23 DIAGNOSIS — R1084 Generalized abdominal pain: Secondary | ICD-10-CM | POA: Insufficient documentation

## 2019-06-23 DIAGNOSIS — Z880 Allergy status to penicillin: Secondary | ICD-10-CM | POA: Insufficient documentation

## 2019-06-23 LAB — CBC WITH DIFFERENTIAL/PLATELET
Abs Immature Granulocytes: 0.02 10*3/uL (ref 0.00–0.07)
Basophils Absolute: 0 10*3/uL (ref 0.0–0.1)
Basophils Relative: 0 %
Eosinophils Absolute: 0.3 10*3/uL (ref 0.0–0.5)
Eosinophils Relative: 4 %
HCT: 40.2 % (ref 36.0–46.0)
Hemoglobin: 12.9 g/dL (ref 12.0–15.0)
Immature Granulocytes: 0 %
Lymphocytes Relative: 30 %
Lymphs Abs: 2.3 10*3/uL (ref 0.7–4.0)
MCH: 26.4 pg (ref 26.0–34.0)
MCHC: 32.1 g/dL (ref 30.0–36.0)
MCV: 82.4 fL (ref 80.0–100.0)
Monocytes Absolute: 0.3 10*3/uL (ref 0.1–1.0)
Monocytes Relative: 4 %
Neutro Abs: 4.8 10*3/uL (ref 1.7–7.7)
Neutrophils Relative %: 62 %
Platelets: 166 10*3/uL (ref 150–400)
RBC: 4.88 MIL/uL (ref 3.87–5.11)
RDW: 15.4 % (ref 11.5–15.5)
WBC: 7.8 10*3/uL (ref 4.0–10.5)
nRBC: 0 % (ref 0.0–0.2)

## 2019-06-23 LAB — URINALYSIS, ROUTINE W REFLEX MICROSCOPIC
Bilirubin Urine: NEGATIVE
Glucose, UA: NEGATIVE mg/dL
Hgb urine dipstick: NEGATIVE
Ketones, ur: NEGATIVE mg/dL
Leukocytes,Ua: NEGATIVE
Nitrite: NEGATIVE
Protein, ur: NEGATIVE mg/dL
Specific Gravity, Urine: 1.02 (ref 1.005–1.030)
pH: 6.5 (ref 5.0–8.0)

## 2019-06-23 LAB — COMPREHENSIVE METABOLIC PANEL
ALT: 27 U/L (ref 0–44)
AST: 24 U/L (ref 15–41)
Albumin: 4 g/dL (ref 3.5–5.0)
Alkaline Phosphatase: 69 U/L (ref 38–126)
Anion gap: 8 (ref 5–15)
BUN: 17 mg/dL (ref 6–20)
CO2: 26 mmol/L (ref 22–32)
Calcium: 9 mg/dL (ref 8.9–10.3)
Chloride: 106 mmol/L (ref 98–111)
Creatinine, Ser: 0.79 mg/dL (ref 0.44–1.00)
GFR calc Af Amer: >60 mL/min (ref >60)
GFR calc non Af Amer: >60 mL/min (ref >60)
Glucose, Bld: 98 mg/dL (ref 70–99)
Potassium: 4.5 mmol/L (ref 3.5–5.1)
Sodium: 140 mmol/L (ref 135–145)
Total Bilirubin: 0.4 mg/dL (ref 0.3–1.2)
Total Protein: 7.2 g/dL (ref 6.5–8.1)

## 2019-06-23 MED ORDER — DICYCLOMINE HCL 20 MG PO TABS: 20.0000 mg | ORAL_TABLET | Freq: Two times a day (BID) | ORAL | 0 refills | Status: DC

## 2019-06-23 MED ORDER — DICYCLOMINE HCL 10 MG PO CAPS
10.0000 mg | ORAL_CAPSULE | Freq: Once | ORAL | Status: AC
  Administered 2019-06-23: 12:00:00 10 mg via ORAL
  Filled 2019-06-23: qty 1

## 2019-06-23 MED ORDER — FENTANYL CITRATE (PF) 100 MCG/2ML IJ SOLN
50.0000 ug | Freq: Once | INTRAMUSCULAR | Status: AC
  Administered 2019-06-23: 10:00:00 50 ug via INTRAVENOUS
  Filled 2019-06-23: qty 2

## 2019-06-23 MED ORDER — IOHEXOL 300 MG/ML  SOLN
100.0000 mL | Freq: Once | INTRAMUSCULAR | Status: AC | PRN
  Administered 2019-06-23: 11:00:00 100 mL via INTRAVENOUS

## 2019-06-23 MED FILL — DICYCLOMINE 20 MG TABLET: 20 | 10 days supply | Qty: 20 | Fill #0

## 2019-06-23 NOTE — ED Notes (Signed)
Bladder scan completed result 43ml post void

## 2019-06-23 NOTE — Discharge Instructions (Addendum)
You were seen in the ER for abdominal pain.  You exam did not show any acute causes of your abdominal pain.  I prescribed a medication called Bentyl which should help with your abdominal pain.  Have also provided a referral for a GI doctor if your pain does not improve.  Please call them to schedule an appointment.  Return to the ER if your symptoms worsen.

## 2019-06-23 NOTE — ED Provider Notes (Signed)
[facility] EMERGENCY DEPARTMENT Provider Note   CSN: [id number] Arrival date & time: 06/23/19  0915     History Chief Complaint  Patient presents with  . Abdominal Pain    Andrea Snyder is a 59 y.o. female.  HPI 59 year old female with a history of UTIs presents to the ER for abdominal pain which wraps around her back on both sides.  Triage notes state right lower abdominal pain, however patient states that her pain is mostly right-sided, with occasional left-sided pain.  Pain is aggravated with movement.  Notes patient states that her pain is only relieved with urination, though she states that she has to strain little bit to get her urine going.  She has not had this kind of pain before, states she is overall a healthy person and takes vitamins every day.  She has concerns for kidney stones as her friend suggested that she may have this.  She denies fevers, chills, nausea, vomiting, diarrhea, constipation, chest pain, shortness of breath, weakness, dysuria, hematuria, abnormal vaginal discharge or odors.  Patient states she has not been sexually active for 2 years.  No history of abdominal surgeries.  Last BM was yesterday and normal.     Past Medical History:  Diagnosis Date  . Cervical rib   . UTI (urinary tract infection)     There are no problems to display for this patient.   History reviewed. No pertinent surgical history.   OB History    Gravida  4   Para  3   Term      Preterm      AB  1   Living  3     SAB      TAB      Ectopic      Multiple      Live Births              Family History  Problem Relation Age of Onset  . Diabetes Mother   . Heart failure Mother   . Hypertension Mother   . Cancer Mother   . Diabetes Father   . Heart failure Father   . Hypertension Father   . Stroke Maternal Grandmother     Social History   Tobacco Use  . Smoking status: Current Some Day Smoker    Types: Cigarettes  . Smokeless tobacco:  Never Used  . Tobacco comment: 3 cigarrettes in 24 hour period - trying to quit  Substance Use Topics  . Alcohol use: Yes    Comment: OCCASIONAL  . Drug use: No    Home Medications Prior to Admission medications   Medication Sig Start Date End Date Taking? Authorizing Provider  calcium gluconate 500 MG tablet Take 1 tablet by mouth 3 (three) times daily.    [provider]  dicyclomine (BENTYL) 20 MG tablet Take 1 tablet (20 mg total) by mouth 2 (two) times daily. 06/23/19   Garald Balding, PA-C  ibuprofen (ADVIL) 400 MG tablet Take 1 tablet (400 mg total) by mouth every 6 (six) hours as needed. 10/12/18   Horton, Barbette Hair, MD  Multiple Vitamins-Minerals (MULTIVITAMIN WITH MINERALS) tablet Take 1 tablet by mouth daily.    [provider]  nortriptyline (PAMELOR) 10 MG capsule Take 1 capsule (10 mg total) by mouth at bedtime. 12/08/18   Pieter Partridge, DO  Omega-3 Fatty Acids (FISH OIL PO) Take 1 capsule by mouth daily.    [provider]    Allergies  Penicillins and Percocet [oxycodone-acetaminophen]  Review of Systems   Review of Systems  Constitutional: Negative for chills and fever.  HENT: Negative for ear pain and sore throat.   Eyes: Negative for pain and visual disturbance.  Respiratory: Negative for cough and shortness of breath.   Cardiovascular: Negative for chest pain and palpitations.  Gastrointestinal: Positive for abdominal pain. Negative for diarrhea, nausea and vomiting.  Genitourinary: Positive for difficulty urinating and flank pain. Negative for decreased urine volume, dysuria, hematuria, vaginal bleeding, vaginal discharge and vaginal pain.  Musculoskeletal: Negative for arthralgias, back pain and myalgias.  Skin: Negative for color change and rash.  Neurological: Negative for dizziness, seizures, syncope, weakness, light-headedness and headaches.  Psychiatric/Behavioral: Negative for confusion.  All other systems reviewed and are  negative.   Physical Exam Updated Vital Signs BP 118/74 (BP Location: Right Arm)   Pulse 60   Temp 98.2 F (36.8 C) (Oral)   Resp 16   Ht 5\' 6"  (1.676 m)   Wt 57.6 kg   LMP 01/11/2012   SpO2 99%   BMI 20.50 kg/m   Physical Exam Vitals and nursing note reviewed.  Constitutional:      General: She is not in acute distress.    Appearance: She is well-developed.  HENT:     Head: Normocephalic and atraumatic.     Mouth/Throat:     Mouth: Mucous membranes are moist.     Pharynx: Oropharynx is clear.  Eyes:     Extraocular Movements: Extraocular movements intact.     Conjunctiva/sclera: Conjunctivae normal.     Pupils: Pupils are equal, round, and reactive to light.  Cardiovascular:     Rate and Rhythm: Normal rate and regular rhythm.     Heart sounds: Normal heart sounds. No murmur.  Pulmonary:     Effort: Pulmonary effort is normal. No respiratory distress.     Breath sounds: Normal breath sounds.  Abdominal:     General: Bowel sounds are normal. There is no distension. There are no signs of injury.     Palpations: Abdomen is soft.     Tenderness: There is abdominal tenderness in the right upper quadrant, right lower quadrant and left lower quadrant. There is right CVA tenderness, left CVA tenderness and guarding. There is no rebound. Negative signs include Murphy's sign and McBurney's sign.     Hernia: No hernia is present.     Comments: Significant guarding, CVA tenderness  Musculoskeletal:     Cervical back: Neck supple.  Skin:    General: Skin is warm and dry.  Neurological:     General: No focal deficit present.     Mental Status: She is alert and oriented to person, place, and time.  Psychiatric:        Mood and Affect: Mood normal.        Behavior: Behavior normal.     ED Results / Procedures / Treatments   Labs (all labs ordered are listed, but only abnormal results are displayed) Labs Reviewed  URINALYSIS, ROUTINE W REFLEX MICROSCOPIC  CBC WITH  DIFFERENTIAL/PLATELET  COMPREHENSIVE METABOLIC PANEL    EKG None  Radiology CT ABDOMEN PELVIS W CONTRAST  Result Date: 06/23/2019 CLINICAL DATA:  Acute abdominal pain. Acute onset RIGHT lower quadrant pain. No nausea, vomiting or diarrhea EXAM: CT ABDOMEN AND PELVIS WITH CONTRAST TECHNIQUE: Multidetector CT imaging of the abdomen and pelvis was performed using the standard protocol following bolus administration of intravenous contrast. CONTRAST:  157mL OMNIPAQUE IOHEXOL 300 MG/ML  SOLN COMPARISON:  January 22, 2012 FINDINGS: Lower chest: Lung bases are unremarkable. No consolidation or pleural effusion. Hepatobiliary: RIGHT hepatic cyst. Mildly variable perfusion in the LEFT hepatic lobe potentially relating to underlying steatosis, normalizes on later imaging. Pancreas: Pancreas normal without focal lesion or inflammation. Spleen: Spleen normal size without focal lesion. Adrenals/Urinary Tract: Adrenal glands are normal. Low-density lesions in the RIGHT kidney largest in the interpolar region in the hilar lip posteriorly likely cysts too small for definitive characterization. No hydronephrosis. No visible nephrolithiasis. Ptotic RIGHT kidney, somewhat low lying but similar to previous imaging. Stomach/Bowel: Stomach is under distended limiting assessment. Paucity of intra-abdominal fat limits assessment of individual bowel loops. Some bowel loops in the central abdomen are fluid-filled and mildly dilated. No perienteric stranding. No pericecal stranding. What is seen of the appendix is normal. Passing from lateral to medial in the RIGHT lower quadrant adjacent to small bowel loops Vascular/Lymphatic: Vascular structures in the abdomen are patent. No adenopathy in the upper abdomen or in the retroperitoneum. Calcified and noncalcified atheromatous plaque in the abdominal aorta. Reproductive: Uterus and adnexa not well assessed due to crowding of abdominal structures. Mild heterogeneity of the uterus  potentially related to fibroids. No adnexal mass. Other: No hernia. No ascites. Musculoskeletal: No acute bone finding. No destructive bone process. IMPRESSION: 1. Some bowel loops in the central abdomen are fluid-filled and mildly dilated. No perienteric stranding. Findings could be seen in mild ileus. 2. The entire appendix is not visualized, what is seen is normal. There are no secondary signs of appendicitis. 3. Aortic atherosclerosis. Aortic Atherosclerosis (ICD10-I70.0). Electronically Signed   By: Zetta Bills M.D.   On: 06/23/2019 11:41   US Abdomen Limited RUQ  Result Date: 06/23/2019 CLINICAL DATA:  Worsening right upper quadrant abdominal tenderness since yesterday. EXAM: ULTRASOUND ABDOMEN LIMITED RIGHT UPPER QUADRANT COMPARISON:  Abdomen and pelvis CT obtained earlier today. FINDINGS: Gallbladder: No gallstones or wall thickening visualized. No sonographic Murphy sign noted by sonographer. Common bile duct: Diameter: 2.6 mm Liver: 1.6 x 1.4 x 1.2 cm oval cystic area in the periphery of the right lobe of the liver. This has a mildly to moderately diffusely thickened and mildly irregular surrounding echogenic wall. This has the appearance of a simple cyst on the CT earlier today. On the CT earlier today, this was significantly smaller than on a previous CT dated 01/22/2012, measuring 3.0 cm in maximum diameter at that time. Portal vein is patent on color Doppler imaging with normal direction of blood flow towards the liver. Other: None. IMPRESSION: 1. 1.6 cm cyst with mildly to moderately thickened and mildly irregular echogenic walls in the right lobe of the liver. The wall thickening and increased echogenicity is most likely related to the significant decrease in size of this cyst since 01/22/2012. 2. Otherwise, normal examination. Electronically Signed   By: Claudie Revering M.D.   On: 06/23/2019 14:11    Procedures Procedures (including critical care time)  Medications Ordered in  ED Medications  fentaNYL (SUBLIMAZE) injection 50 mcg (50 mcg Intravenous Given 06/23/19 0959)  iohexol (OMNIPAQUE) 300 MG/ML solution 100 mL (100 mLs Intravenous Contrast Given 06/23/19 1048)  dicyclomine (BENTYL) capsule 10 mg (10 mg Oral Given 06/23/19 1158)    ED Course  I have reviewed the triage vital signs and the nursing notes.  Pertinent labs & imaging results that were available during my care of the patient were reviewed by me and considered in my medical decision making (see chart for details).  Clinical Course as of Jun 22 1537  Fri Jun 23, 2019  1156 CMP without electrolyte abnormalities, renal dysfunction.  UA without evidence of UTI.  CBC without leukocytosis, normal hemoglobin, normal platelets.   [MB]  [id number] CT abdomen impression:  IMPRESSION: 1. Some bowel loops in the central abdomen are fluid-filled and mildly dilated. No perienteric stranding. Findings could be seen in mild ileus. 2. The entire appendix is not visualized, what is seen is normal. There are no secondary signs of appendicitis. 3. Aortic atherosclerosis.  No evidence of appendicitis, renal stones.   [MB]  [id number] Will administer Bentyl and reevaluate   [MB]  [id number] On serial abdominal reevaluation, patient is still tender to palpation most significantly in her right periumbilical area.  Dr. Gilford Raid also examined the patient, noted right upper quadrant pain, will order right upper quadrant ultrasound.   [MB]  1425 RUQ US impression:  IMPRESSION: 1. 1.6 cm cyst with mildly to moderately thickened and mildly irregular echogenic walls in the right lobe of the liver. The wall thickening and increased echogenicity is most likely related to the significant decrease in size of this cyst since 01/22/2012. 2. Otherwise, normal examination.     [MB]    Clinical Course User Index [MB] Lyndel Safe   MDM Rules/Calculators/A&P                     59 year old female with right and left-sided  abdominal pain, but worse on the right with flank pain x2 days. On presentation to the ER, patient nontoxic-appearing, in no acute distress, resting comfortably in the ER bed.  Vitals reassuring.  Physical exam noted below for significant guarding, patient extremely tender to right upper and lower abdomen, and left lower quadrant.  Also notable significant CVA tenderness bilaterally.  Patient jumping up as I palpate her abdomen.  We will start with basic labs, UA, CT abdomen.  CMP without significant electrode normalities, or renal dysfunction.  CBC without leukocytosis, normal platelets.  UA without evidence of UTI.  See ED course for results of CT abdomen/RUQ ultrasound.  On serial abdominal reexamination, patient notes mild improvement of abdominal pain with fentanyl and Bentyl.  Unclear etiology of abdominal pain.  CT negative for acute intra-abdominal pathology.  Right upper ultrasound decreased in size cyst from previous ultrasound done in 2013 patient but otherwise normal.  Doubt constipation, ileus as patient had normal bowel movement yesterday, no nausea/vomiting.  Tolerating p.o. well.  Bladder scan post void with 18 mL, nonconcerning.  Patient is aware of liver cyst from 2013.  Unclear etiology of abdominal pain.  I will DC with Bentyl, close GI follow-up.  Patient voices understanding, is overall reassured by the work-up and is agreeable to this plan.   The patient was seen and evaluated by Dr. Gilford Raid and she is agreeable to the above plan. Final Clinical Impression(s) / ED Diagnoses Final diagnoses:  RUQ abdominal tenderness  Generalized abdominal pain    Rx / DC Orders ED Discharge Orders         Ordered    dicyclomine (BENTYL) 20 MG tablet  2 times daily     06/23/19 1418           Lyndel Safe 06/23/19 1543    Isla Pence, MD 06/25/19 [phone]

## 2019-06-23 NOTE — ED Triage Notes (Signed)
Pt states awoke with pain right lower quadrant, relieved slightly during urination.  Denies fever, denies n/v/d.  Ambulated to bathroom to provide urine specimen with steady gait.

## 2019-06-23 NOTE — ED Notes (Signed)
ED Provider at bedside. 

## 2019-11-25 ENCOUNTER — Other Ambulatory Visit: Payer: Self-pay

## 2019-11-25 ENCOUNTER — Encounter (HOSPITAL_BASED_OUTPATIENT_CLINIC_OR_DEPARTMENT_OTHER): Payer: Self-pay | Admitting: *Deleted

## 2019-11-25 DIAGNOSIS — N390 Urinary tract infection, site not specified: Secondary | ICD-10-CM | POA: Insufficient documentation

## 2019-11-25 DIAGNOSIS — F17290 Nicotine dependence, other tobacco product, uncomplicated: Secondary | ICD-10-CM | POA: Insufficient documentation

## 2019-11-25 NOTE — ED Triage Notes (Signed)
Pt reports left flank/LLQ/central abd pain since Thursday. "burning pain". States she had similar pain when she had covid in July. Also states she has not been sexually active x 1 year

## 2019-11-26 ENCOUNTER — Encounter (HOSPITAL_BASED_OUTPATIENT_CLINIC_OR_DEPARTMENT_OTHER): Payer: Self-pay | Admitting: Emergency Medicine

## 2019-11-26 ENCOUNTER — Emergency Department (HOSPITAL_BASED_OUTPATIENT_CLINIC_OR_DEPARTMENT_OTHER)
Admission: EM | Admit: 2019-11-26 | Discharge: 2019-11-26 | Disposition: A | Discharge status: Home / Self Care | Payer: Self-pay | Attending: Emergency Medicine | Admitting: Emergency Medicine

## 2019-11-26 ENCOUNTER — Telehealth (HOSPITAL_BASED_OUTPATIENT_CLINIC_OR_DEPARTMENT_OTHER): Payer: Self-pay | Admitting: Emergency Medicine

## 2019-11-26 DIAGNOSIS — N390 Urinary tract infection, site not specified: Secondary | ICD-10-CM

## 2019-11-26 LAB — URINALYSIS, MICROSCOPIC (REFLEX)

## 2019-11-26 LAB — URINALYSIS, ROUTINE W REFLEX MICROSCOPIC
Bilirubin Urine: NEGATIVE
Glucose, UA: NEGATIVE mg/dL
Ketones, ur: NEGATIVE mg/dL
Leukocytes,Ua: NEGATIVE
Nitrite: POSITIVE — AB
Protein, ur: NEGATIVE mg/dL
Specific Gravity, Urine: 1.025 (ref 1.005–1.030)
pH: 6 (ref 5.0–8.0)

## 2019-11-26 MED ORDER — SULFAMETHOXAZOLE-TRIMETHOPRIM 800-160 MG PO TABS: 1.0000 | ORAL_TABLET | Freq: Two times a day (BID) | ORAL | 0 refills | Status: AC

## 2019-11-26 MED ORDER — SULFAMETHOXAZOLE-TRIMETHOPRIM 800-160 MG PO TABS
1.0000 | ORAL_TABLET | Freq: Once | ORAL | Status: AC
  Administered 2019-11-26: 1 via ORAL
  Filled 2019-11-26: qty 1

## 2019-11-26 MED ORDER — PHENAZOPYRIDINE HCL 200 MG PO TABS: 200.0000 mg | ORAL_TABLET | Freq: Three times a day (TID) | ORAL | 0 refills | Status: DC | PRN

## 2019-11-26 NOTE — ED Provider Notes (Signed)
[facility] EMERGENCY DEPARTMENT Provider Note   CSN: [id number] Arrival date & time: 11/25/19  2304     History Chief Complaint  Patient presents with   Abdominal Pain    Andrea Snyder is a 59 y.o. female.  The history is provided by the patient.  Abdominal Pain Pain location:  L flank Pain quality: not burning   Pain radiates to:  Does not radiate Pain severity:  Moderate Onset quality:  Gradual Timing:  Constant Progression:  Unchanged Chronicity:  Recurrent Context: not awakening from sleep, not eating and not trauma   Relieved by:  Nothing Worsened by:  Nothing Ineffective treatments:  None tried Associated symptoms: dysuria   Associated symptoms: no anorexia, no belching, no chest pain, no chills, no constipation, no cough, no diarrhea, no fatigue, no fever, no flatus, no hematemesis, no hematochezia, no hematuria, no melena, no nausea, no shortness of breath, no sore throat, no vaginal bleeding, no vaginal discharge and no vomiting   Risk factors: no alcohol abuse        Past Medical History:  Diagnosis Date   Cervical rib    UTI (urinary tract infection)     There are no problems to display for this patient.   History reviewed. No pertinent surgical history.   OB History    Gravida  4   Para  3   Term      Preterm      AB  1   Living  3     SAB      TAB      Ectopic      Multiple      Live Births              Family History  Problem Relation Age of Onset   Diabetes Mother    Heart failure Mother    Hypertension Mother    Cancer Mother    Diabetes Father    Heart failure Father    Hypertension Father    Stroke Maternal Grandmother     Social History   Tobacco Use   Smoking status: Current Some Day Smoker    Types: Cigars   Smokeless tobacco: Never Used   Tobacco comment: 3 cigarrettes in 24 hour period - trying to quit  Vaping Use   Vaping Use: Never used  Substance Use Topics    Alcohol use: Not Currently    Comment: OCCASIONAL   Drug use: No    Home Medications Prior to Admission medications   Medication Sig Start Date End Date Taking? Authorizing Provider  calcium gluconate 500 MG tablet Take 1 tablet by mouth 3 (three) times daily.    [provider]  dicyclomine (BENTYL) 20 MG tablet Take 1 tablet (20 mg total) by mouth 2 (two) times daily. 06/23/19   Garald Balding, PA-C  ibuprofen (ADVIL) 400 MG tablet Take 1 tablet (400 mg total) by mouth every 6 (six) hours as needed. 10/12/18   Horton, Barbette Hair, MD  Multiple Vitamins-Minerals (MULTIVITAMIN WITH MINERALS) tablet Take 1 tablet by mouth daily.    [provider]  nortriptyline (PAMELOR) 10 MG capsule Take 1 capsule (10 mg total) by mouth at bedtime. 12/08/18   Pieter Partridge, DO  Omega-3 Fatty Acids (FISH OIL PO) Take 1 capsule by mouth daily.    [provider]    Allergies    Penicillins and Percocet [oxycodone-acetaminophen]  Review of Systems   Review of Systems  Constitutional:  Negative for chills, fatigue and fever.  HENT: Negative for sore throat.   Eyes: Negative for visual disturbance.  Respiratory: Negative for cough and shortness of breath.   Cardiovascular: Negative for chest pain.  Gastrointestinal: Positive for abdominal pain. Negative for anorexia, constipation, diarrhea, flatus, hematemesis, hematochezia, melena, nausea and vomiting.  Endocrine: Negative for polyuria.  Genitourinary: Positive for dysuria. Negative for hematuria, vaginal bleeding and vaginal discharge.  Musculoskeletal: Negative for arthralgias.  Skin: Negative for color change.  Neurological: Negative for dizziness.  Psychiatric/Behavioral: Negative for agitation.  All other systems reviewed and are negative.   Physical Exam Updated Vital Signs BP 126/81 (BP Location: Right Arm)    Pulse 78    Temp 98.3 F (36.8 C) (Oral)    Resp 16    Ht 5' 6.5" (1.689 m)    Wt 59 kg    LMP 01/11/2012     SpO2 97%    BMI 20.67 kg/m   Physical Exam Vitals and nursing note reviewed.  Constitutional:      General: She is not in acute distress.    Appearance: Normal appearance.  HENT:     Head: Normocephalic and atraumatic.     Nose: Nose normal.  Eyes:     Conjunctiva/sclera: Conjunctivae normal.     Pupils: Pupils are equal, round, and reactive to light.  Cardiovascular:     Rate and Rhythm: Normal rate and regular rhythm.     Pulses: Normal pulses.     Heart sounds: Normal heart sounds.  Pulmonary:     Effort: Pulmonary effort is normal.     Breath sounds: Normal breath sounds.  Abdominal:     General: Abdomen is flat.     Palpations: Abdomen is soft.     Tenderness: There is no abdominal tenderness. There is no guarding or rebound.     Comments: Gassy   Musculoskeletal:        General: Normal range of motion.     Cervical back: Normal range of motion and neck supple.  Skin:    General: Skin is warm and dry.     Capillary Refill: Capillary refill takes less than 2 seconds.  Neurological:     General: No focal deficit present.     Mental Status: She is alert and oriented to person, place, and time.     Deep Tendon Reflexes: Reflexes normal.  Psychiatric:        Mood and Affect: Mood normal.        Behavior: Behavior normal.     ED Results / Procedures / Treatments   Labs (all labs ordered are listed, but only abnormal results are displayed) Results for orders placed or performed during the hospital encounter of 11/26/19  Urinalysis, Routine w reflex microscopic Urine, Clean Catch  Result Value Ref Range   Color, Urine YELLOW YELLOW   APPearance HAZY (A) CLEAR   Specific Gravity, Urine 1.025 1.005 - 1.030   pH 6.0 5.0 - 8.0   Glucose, UA NEGATIVE NEGATIVE mg/dL   Hgb urine dipstick TRACE (A) NEGATIVE   Bilirubin Urine NEGATIVE NEGATIVE   Ketones, ur NEGATIVE NEGATIVE mg/dL   Protein, ur NEGATIVE NEGATIVE mg/dL   Nitrite POSITIVE (A) NEGATIVE   Leukocytes,Ua  NEGATIVE NEGATIVE  Urinalysis, Microscopic (reflex)  Result Value Ref Range   RBC / HPF 0-5 0 - 5 RBC/hpf   WBC, UA 0-5 0 - 5 WBC/hpf   Bacteria, UA MANY (A) NONE SEEN   Squamous Epithelial / LPF  0-5 0 - 5   No results found.  Radiology No results found.  Procedures Procedures (including critical care time)  Medications Ordered in ED Medications  sulfamethoxazole-trimethoprim (BACTRIM DS) 800-160 MG per tablet 1 tablet (has no administration in time range)    ED Course  I have reviewed the triage vital signs and the nursing notes.  Pertinent labs & imaging results that were available during my care of the patient were reviewed by me and considered in my medical decision making (see chart for details).    Patient has a UTI.  Will treat with bactrim given her PCN allergy.  I do not believe this is a stone.  I do not believe this is a surgical cause of pain. No signs of sepsis, well appearing.  Patient PO challenged in the ED.   I will treat for 10 days.  Follow up with your PMD for recheck.     Andrea Snyder was evaluated in Emergency Department on 11/26/2019 for the symptoms described in the history of present illness. She was evaluated in the context of the global COVID-19 pandemic, which necessitated consideration that the patient might be at risk for infection with the SARS-CoV-2 virus that causes COVID-19. Institutional protocols and algorithms that pertain to the evaluation of patients at risk for COVID-19 are in a state of rapid change based on information released by regulatory bodies including the CDC and federal and state organizations. These policies and algorithms were followed during the patient's care in the ED.  Final Clinical Impression(s) / ED Diagnoses Return for intractable cough, coughing up blood,fevers >100.4 unrelieved by medication, shortness of breath, intractable vomiting, chest pain, shortness of breath, weakness,numbness, changes in speech, facial  asymmetry,abdominal pain, passing out,Inability to tolerate liquids or food, cough, altered mental status or any concerns. No signs of systemic illness or infection. The patient is nontoxic-appearing on exam and vital signs are within normal limits.   I have reviewed the triage vital signs and the nursing notes. Pertinent labs &imaging results that were available during my care of the patient were reviewed by me and considered in my medical decision making (see chart for details).After history, exam, and medical workup I feel the patient has beenappropriately medically screened and is safe for discharge home. Pertinent diagnoses were discussed with the patient. Patient was given return precautions.    Keats Kingry, MD 11/26/19 [phone]

## 2020-11-17 ENCOUNTER — Emergency Department (HOSPITAL_BASED_OUTPATIENT_CLINIC_OR_DEPARTMENT_OTHER)
Admission: EM | Admit: 2020-11-17 | Discharge: 2020-11-18 | Disposition: A | Discharge status: Home / Self Care | Payer: Self-pay | Attending: Emergency Medicine | Admitting: Emergency Medicine

## 2020-11-17 ENCOUNTER — Emergency Department (HOSPITAL_BASED_OUTPATIENT_CLINIC_OR_DEPARTMENT_OTHER): Payer: Self-pay

## 2020-11-17 ENCOUNTER — Encounter (HOSPITAL_BASED_OUTPATIENT_CLINIC_OR_DEPARTMENT_OTHER): Payer: Self-pay | Admitting: *Deleted

## 2020-11-17 ENCOUNTER — Other Ambulatory Visit: Payer: Self-pay

## 2020-11-17 DIAGNOSIS — N3000 Acute cystitis without hematuria: Secondary | ICD-10-CM | POA: Insufficient documentation

## 2020-11-17 DIAGNOSIS — R202 Paresthesia of skin: Secondary | ICD-10-CM | POA: Insufficient documentation

## 2020-11-17 DIAGNOSIS — R531 Weakness: Secondary | ICD-10-CM | POA: Insufficient documentation

## 2020-11-17 DIAGNOSIS — R600 Localized edema: Secondary | ICD-10-CM | POA: Insufficient documentation

## 2020-11-17 DIAGNOSIS — Z87891 Personal history of nicotine dependence: Secondary | ICD-10-CM | POA: Insufficient documentation

## 2020-11-17 LAB — CBC WITH DIFFERENTIAL/PLATELET
Abs Immature Granulocytes: 0.01 10*3/uL (ref 0.00–0.07)
Basophils Absolute: 0.1 10*3/uL (ref 0.0–0.1)
Basophils Relative: 1 %
Eosinophils Absolute: 0.3 10*3/uL (ref 0.0–0.5)
Eosinophils Relative: 4 %
HCT: 36.8 % (ref 36.0–46.0)
Hemoglobin: 11.9 g/dL — ABNORMAL LOW (ref 12.0–15.0)
Immature Granulocytes: 0 %
Lymphocytes Relative: 42 %
Lymphs Abs: 3.1 10*3/uL (ref 0.7–4.0)
MCH: 26 pg (ref 26.0–34.0)
MCHC: 32.3 g/dL (ref 30.0–36.0)
MCV: 80.3 fL (ref 80.0–100.0)
Monocytes Absolute: 0.3 10*3/uL (ref 0.1–1.0)
Monocytes Relative: 4 %
Neutro Abs: 3.6 10*3/uL (ref 1.7–7.7)
Neutrophils Relative %: 49 %
Platelets: 217 10*3/uL (ref 150–400)
RBC: 4.58 MIL/uL (ref 3.87–5.11)
RDW: 15.9 % — ABNORMAL HIGH (ref 11.5–15.5)
WBC: 7.4 10*3/uL (ref 4.0–10.5)
nRBC: 0 % (ref 0.0–0.2)

## 2020-11-17 LAB — URINALYSIS, ROUTINE W REFLEX MICROSCOPIC
Bilirubin Urine: NEGATIVE
Glucose, UA: NEGATIVE mg/dL
Hgb urine dipstick: NEGATIVE
Ketones, ur: NEGATIVE mg/dL
Leukocytes,Ua: NEGATIVE
Nitrite: POSITIVE — AB
Protein, ur: NEGATIVE mg/dL
Specific Gravity, Urine: 1.03 (ref 1.005–1.030)
pH: 6 (ref 5.0–8.0)

## 2020-11-17 LAB — COMPREHENSIVE METABOLIC PANEL
ALT: 18 U/L (ref 0–44)
AST: 19 U/L (ref 15–41)
Albumin: 4.1 g/dL (ref 3.5–5.0)
Alkaline Phosphatase: 64 U/L (ref 38–126)
Anion gap: 8 (ref 5–15)
BUN: 16 mg/dL (ref 6–20)
CO2: 27 mmol/L (ref 22–32)
Calcium: 9.4 mg/dL (ref 8.9–10.3)
Chloride: 104 mmol/L (ref 98–111)
Creatinine, Ser: 0.86 mg/dL (ref 0.44–1.00)
GFR, Estimated: >60 mL/min (ref >60)
Glucose, Bld: 86 mg/dL (ref 70–99)
Potassium: 3.5 mmol/L (ref 3.5–5.1)
Sodium: 139 mmol/L (ref 135–145)
Total Bilirubin: 0.2 mg/dL — ABNORMAL LOW (ref 0.3–1.2)
Total Protein: 7.5 g/dL (ref 6.5–8.1)

## 2020-11-17 LAB — URINALYSIS, MICROSCOPIC (REFLEX)

## 2020-11-17 LAB — TROPONIN I (HIGH SENSITIVITY): Troponin I (High Sensitivity): <2 ng/L (ref <18)

## 2020-11-17 MED ORDER — LACTATED RINGERS IV BOLUS
1000.0000 mL | Freq: Once | INTRAVENOUS | Status: AC
  Administered 2020-11-17: 1000 mL via INTRAVENOUS

## 2020-11-17 NOTE — ED Triage Notes (Signed)
Pt reports difficulty walking today in grocery store this evening (points to left leg)- states she is having  numbness in her legs and arms and everything "feels tight" in her body. States she has had 2 other episodes like this in the last few months. Reports tightness in chest and extremities

## 2020-11-18 MED ORDER — NITROFURANTOIN MONOHYD MACRO 100 MG PO CAPS: 100.0000 mg | ORAL_CAPSULE | Freq: Two times a day (BID) | ORAL | 0 refills | Status: DC

## 2020-11-18 NOTE — ED Provider Notes (Signed)
[facility] Provider Note   CSN: [id number] Arrival date & time: 11/17/20  2010     History Chief Complaint  Patient presents with   Numbness    Andrea Snyder is a 60 y.o. female.  Patient is a 60 year old female with no known medical problems who is presenting today with multiple complaints.  Patient reports for the last several months she has had burning tingling pain in bilateral hands and feet, intermittent fatigue and just not feeling herself.  In the last few days she has noticed that all the symptoms have been getting worse and her body just feels tight all over.  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  She was taking ibuprofen as needed for some aches and pains and trying to get plenty of rest.  Today she was at the grocery store with her daughter when she just started feeling worse and it felt like her legs would not hold her up.  She had to sit down and rest because she felt so generally weak but it did feel little bit worse on the left side.  She had no speech problems or visual changes.  Denies any headache.  She has not had cough, congestion or significant shortness of breath.  She feels tightness throughout her chest, abdomen, back, arms and legs.  She has noticed some swelling in her right knee that is new but denies any pain or injury.  She takes no prescription medications but does take vitamins over-the-counter.  She eats a normal diet.  She feels like symptoms may have all started after she had her second round of COVID in July.  She no longer uses tobacco and is not on any oral contraceptives and denies any recent immobilization, calf pain or swelling.  The history is provided by the patient.      Past Medical History:  Diagnosis Date   Cervical rib    UTI  (urinary tract infection)     There are no problems to display for this patient.   History reviewed. No pertinent surgical history.   OB History     Gravida  4   Para  3   Term      Preterm      AB  1   Living  3      SAB      IAB      Ectopic      Multiple      Live Births              Family History  Problem Relation Age of Onset   Diabetes Mother    Heart failure Mother    Hypertension Mother    Cancer Mother    Diabetes Father    Heart failure Father    Hypertension Father    Stroke Maternal Grandmother     Social History   Tobacco Use   Smoking status: Former    Types: Cigars   Smokeless tobacco: Never   Tobacco comments:    3 cigarrettes in 24 hour period - trying to quit  Vaping Use   Vaping Use: Never used  Substance Use Topics   Alcohol use: Not Currently    Comment: OCCASIONAL   Drug use: No    Home  Medications Prior to Admission medications   Medication Sig Start Date End Date Taking? Authorizing Provider  nitrofurantoin, macrocrystal-monohydrate, (MACROBID) 100 MG capsule Take 1 capsule (100 mg total) by mouth 2 (two) times daily. 11/18/20  Yes Blanchie Dessert, MD  calcium gluconate 500 MG tablet Take 1 tablet by mouth 3 (three) times daily.    [provider]  dicyclomine (BENTYL) 20 MG tablet Take 1 tablet (20 mg total) by mouth 2 (two) times daily. 06/23/19   Garald Balding, PA-C  ibuprofen (ADVIL) 400 MG tablet Take 1 tablet (400 mg total) by mouth every 6 (six) hours as needed. 10/12/18   Horton, Barbette Hair, MD  Multiple Vitamins-Minerals (MULTIVITAMIN WITH MINERALS) tablet Take 1 tablet by mouth daily.    [provider]  nortriptyline (PAMELOR) 10 MG capsule Take 1 capsule (10 mg total) by mouth at bedtime. 12/08/18   Pieter Partridge, DO  Omega-3 Fatty Acids (FISH OIL PO) Take 1 capsule by mouth daily.    [provider]  phenazopyridine (PYRIDIUM) 200 MG tablet Take 1 tablet (200 mg total) by  mouth 3 (three) times daily as needed for pain. 11/26/19   Palumbo, April, MD    Allergies    Penicillins and Percocet [oxycodone-acetaminophen]  Review of Systems   Review of Systems  All other systems reviewed and are negative.  Physical Exam Updated Vital Signs BP 138/77   Pulse (!) 57   Temp 98.5 F (36.9 C) (Oral)   Resp 14   Ht 5' 6.5" (1.689 m)   Wt 62.1 kg   LMP 01/11/2012   SpO2 100%   BMI 21.78 kg/m   Physical Exam Vitals and nursing note reviewed.  Constitutional:      General: She is not in acute distress.    Appearance: She is well-developed.  HENT:     Head: Normocephalic and atraumatic.     Nose: Nose normal.     Mouth/Throat:     Mouth: Mucous membranes are moist.  Eyes:     General: No visual field deficit.    Pupils: Pupils are equal, round, and reactive to light.  Cardiovascular:     Rate and Rhythm: Normal rate and regular rhythm.     Pulses: Normal pulses.     Heart sounds: Normal heart sounds. No murmur heard.   No friction rub.  Pulmonary:     Effort: Pulmonary effort is normal.     Breath sounds: Normal breath sounds. No wheezing or rales.  Abdominal:     General: Bowel sounds are normal. There is no distension.     Palpations: Abdomen is soft.     Tenderness: There is no abdominal tenderness. There is no guarding or rebound.  Musculoskeletal:        General: No tenderness. Normal range of motion.     Right lower leg: No edema.     Left lower leg: No edema.     Comments: Marland Kitchen  Mild swelling noted to the right knee but full rom and no tenderness. No edema  Skin:    General: Skin is warm and dry.     Findings: No rash.  Neurological:     Mental Status: She is alert and oriented to person, place, and time.     Cranial Nerves: No cranial nerve deficit or dysarthria.     Sensory: No sensory deficit.     Motor: No pronator drift.     Coordination: Coordination is intact.     Comments:  When she walks slightly more board based and slaps her  feet a little while walking.  She has 5/5 strength in bilateral upper and lower ext without pronator drift but appears to take more effort than one would expect.  Sensation intact.  Psychiatric:        Mood and Affect: Mood normal.        Behavior: Behavior normal.    ED Results / Procedures / Treatments   Labs (all labs ordered are listed, but only abnormal results are displayed) Labs Reviewed  URINALYSIS, ROUTINE W REFLEX MICROSCOPIC - Abnormal; Notable for the following components:      Result Value   Nitrite POSITIVE (*)    All other components within normal limits  CBC WITH DIFFERENTIAL/PLATELET - Abnormal; Notable for the following components:   Hemoglobin 11.9 (*)    RDW 15.9 (*)    All other components within normal limits  COMPREHENSIVE METABOLIC PANEL - Abnormal; Notable for the following components:   Total Bilirubin 0.2 (*)    All other components within normal limits  URINALYSIS, MICROSCOPIC (REFLEX) - Abnormal; Notable for the following components:   Bacteria, UA MANY (*)    All other components within normal limits  TROPONIN I (HIGH SENSITIVITY)    EKG EKG Interpretation  Date/Time:  Sunday November 17 2020 20:23:17 EDT Ventricular Rate:  68 PR Interval:  166 QRS Duration: 88 QT Interval:  384 QTC Calculation: 409 R Axis:   72 Text Interpretation: Sinus rhythm Consider left ventricular hypertrophy No previous tracing Confirmed by Blanchie Dessert [phone]) on 11/17/2020 10:02:46 PM  Radiology DG Chest 2 View  Result Date: 11/17/2020 CLINICAL DATA:  chest tightness. EXAM: CHEST - 2 VIEW COMPARISON:  11/27/2006 FINDINGS: The heart size and mediastinal contours are within normal limits. Both lungs are clear. The visualized skeletal structures are unremarkable. IMPRESSION: No active cardiopulmonary disease. Electronically Signed   By: Fidela Salisbury M.D.   On: 11/17/2020 21:29   CT HEAD WO CONTRAST (5MM)  Result Date: 11/17/2020 CLINICAL DATA:  Neuro deficit,  acute, stroke suspected. Gait abnormality EXAM: CT HEAD WITHOUT CONTRAST TECHNIQUE: Contiguous axial images were obtained from the base of the skull through the vertex without intravenous contrast. COMPARISON:  10/12/2018 FINDINGS: Brain: Normal anatomic configuration. No abnormal intra or extra-axial mass lesion or fluid collection. No abnormal mass effect or midline shift. No evidence of acute intracranial hemorrhage or infarct. Ventricular size is normal. Cerebellum unremarkable. Vascular: Unremarkable Skull: Intact Sinuses/Orbits: Paranasal sinuses are clear. Orbits are unremarkable. Other: Mastoid air cells and middle ear cavities are clear. IMPRESSION: No acute intracranial abnormality.  Unremarkable examination. Electronically Signed   By: Fidela Salisbury M.D.   On: 11/17/2020 21:32    Procedures Procedures   Medications Ordered in ED Medications  lactated ringers bolus 1,000 mL ( Intravenous Stopped 11/17/20 2227)    ED Course  I have reviewed the triage vital signs and the nursing notes.  Pertinent labs & imaging results that were available during my care of the patient were reviewed by me and considered in my medical decision making (see chart for details).    MDM Rules/Calculators/A&P                           Patient is a 60 year old female presenting today with multiple complaints of paresthesias in her hands and feet, thigh pain and burning and feeling weak in her lower extremities with activity that has been gradually persistent for  the last few months but worse in the last few days.  She has also been complaining of foul-smelling urine and urinary frequency in the last few days as well as some back pain.  Symptoms seem to be more global and not unilateral.  This patient's exam findings are not consistent with stroke today.  No evidence of diabetes, acute cardiac etiology or electrolyte abnormalities.  Patient's labs including CBC, CMP, troponin, EKG and chest x-ray are all within  normal limits.  Does have findings concerning for possible UTI with positive nitrites and many bacteria.  Given she is complaining of urinary symptoms and has a history of UTIs in the past we will treat with Macrobid.  However patient's symptoms of paresthesias are concerning for possible peripheral neuropathy.  Patient does work around a lot of Sports [employer] and does work in some very old building so concern for possible heavy metals like lead versus other sources for neuropathy.  Discussed with patient's importance of following up for further testing.  Patient is comfortable with this plan and she was discharged home.  MDM   Amount and/or Complexity of Data Reviewed Clinical lab tests: ordered and reviewed Tests in the radiology section of CPT: ordered and reviewed Tests in the medicine section of CPT: ordered and reviewed Independent visualization of images, tracings, or specimens: yes    Final Clinical Impression(s) / ED Diagnoses Final diagnoses:  Paresthesia  Acute cystitis without hematuria  Weakness    Rx / DC Orders ED Discharge Orders          Ordered    nitrofurantoin, macrocrystal-monohydrate, (MACROBID) 100 MG capsule  2 times daily        11/18/20 0005             Blanchie Dessert, MD 11/18/20 0014

## 2020-11-18 NOTE — Discharge Instructions (Addendum)
All your labs today were normal with no signs of diabetes or kidney problems.  Your electrolytes and everything with your heart was normal.  However with the symptoms you have been experiencing you need to follow-up with her regular doctor with the neurologist you have seen in the past to be tested for lead levels and other heavy metals as well as vitamin deficiencies which may be causing the weakness in your legs and paresthesias which is the numbness and tingling.

## 2020-12-04 ENCOUNTER — Emergency Department (HOSPITAL_COMMUNITY): Payer: No Typology Code available for payment source

## 2020-12-04 ENCOUNTER — Other Ambulatory Visit: Payer: Self-pay

## 2020-12-04 ENCOUNTER — Emergency Department (HOSPITAL_COMMUNITY)
Admission: EM | Admit: 2020-12-04 | Discharge: 2020-12-04 | Disposition: A | Discharge status: Home / Self Care | Payer: No Typology Code available for payment source | Attending: Emergency Medicine | Admitting: Emergency Medicine

## 2020-12-04 ENCOUNTER — Encounter (HOSPITAL_COMMUNITY): Payer: Self-pay

## 2020-12-04 DIAGNOSIS — R519 Headache, unspecified: Secondary | ICD-10-CM | POA: Diagnosis present

## 2020-12-04 DIAGNOSIS — M545 Low back pain, unspecified: Secondary | ICD-10-CM | POA: Insufficient documentation

## 2020-12-04 DIAGNOSIS — Y92481 Parking lot as the place of occurrence of the external cause: Secondary | ICD-10-CM | POA: Insufficient documentation

## 2020-12-04 DIAGNOSIS — Z87891 Personal history of nicotine dependence: Secondary | ICD-10-CM | POA: Diagnosis not present

## 2020-12-04 MED ORDER — IBUPROFEN 200 MG PO TABS
600.0000 mg | ORAL_TABLET | Freq: Once | ORAL | Status: AC
  Administered 2020-12-04: 600 mg via ORAL
  Filled 2020-12-04: qty 3

## 2020-12-04 MED ORDER — ACETAMINOPHEN 325 MG PO TABS
650.0000 mg | ORAL_TABLET | Freq: Once | ORAL | Status: AC
  Administered 2020-12-04: 650 mg via ORAL
  Filled 2020-12-04: qty 2
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
## 2020-12-04 NOTE — Discharge Instructions (Addendum)
Your x-ray did not show any fracture or dislocation.  Take Tylenol and Motrin as needed at home for pain.  Call your primary care doctor in the next 2-3 days for a follow up.   Return immediately back to the ER if:  Your symptoms worsen within the next 12-24 hours. You develop new symptoms such as new fevers, persistent vomiting, new pain, shortness of breath, or new weakness or numbness, or if you have any other concerns.

## 2021-01-06 ENCOUNTER — Telehealth: Payer: Self-pay | Admitting: Neurology

## 2021-02-11 DIAGNOSIS — M545 Low back pain, unspecified: Secondary | ICD-10-CM | POA: Insufficient documentation

## 2021-02-20 ENCOUNTER — Ambulatory Visit (INDEPENDENT_AMBULATORY_CARE_PROVIDER_SITE_OTHER): Payer: No Typology Code available for payment source | Admitting: Internal Medicine

## 2021-02-20 ENCOUNTER — Encounter: Payer: Self-pay | Admitting: Internal Medicine

## 2021-02-20 ENCOUNTER — Other Ambulatory Visit: Payer: Self-pay

## 2021-02-20 VITALS — BP 138/84 | HR 87 | Temp 98.6°F | Ht 66.5 in | Wt 139.0 lb

## 2021-02-20 DIAGNOSIS — F17201 Nicotine dependence, unspecified, in remission: Secondary | ICD-10-CM | POA: Diagnosis not present

## 2021-02-20 DIAGNOSIS — R202 Paresthesia of skin: Secondary | ICD-10-CM | POA: Diagnosis not present

## 2021-02-20 DIAGNOSIS — R300 Dysuria: Secondary | ICD-10-CM

## 2021-02-20 DIAGNOSIS — D539 Nutritional anemia, unspecified: Secondary | ICD-10-CM | POA: Diagnosis not present

## 2021-02-20 DIAGNOSIS — Z124 Encounter for screening for malignant neoplasm of cervix: Secondary | ICD-10-CM

## 2021-02-20 DIAGNOSIS — E785 Hyperlipidemia, unspecified: Secondary | ICD-10-CM | POA: Diagnosis not present

## 2021-02-20 DIAGNOSIS — Z0001 Encounter for general adult medical examination with abnormal findings: Secondary | ICD-10-CM

## 2021-02-20 DIAGNOSIS — Z23 Encounter for immunization: Secondary | ICD-10-CM

## 2021-02-20 DIAGNOSIS — Z1231 Encounter for screening mammogram for malignant neoplasm of breast: Secondary | ICD-10-CM | POA: Insufficient documentation

## 2021-02-20 DIAGNOSIS — Z1211 Encounter for screening for malignant neoplasm of colon: Secondary | ICD-10-CM

## 2021-02-20 LAB — CBC WITH DIFFERENTIAL/PLATELET
Basophils Absolute: 0 10*3/uL (ref 0.0–0.1)
Basophils Relative: 0.4 % (ref 0.0–3.0)
Eosinophils Absolute: 0.2 10*3/uL (ref 0.0–0.7)
Eosinophils Relative: 2.2 % (ref 0.0–5.0)
HCT: 38.4 % (ref 36.0–46.0)
Hemoglobin: 12.4 g/dL (ref 12.0–15.0)
Lymphocytes Relative: 24.3 % (ref 12.0–46.0)
Lymphs Abs: 1.7 10*3/uL (ref 0.7–4.0)
MCHC: 32.4 g/dL (ref 30.0–36.0)
MCV: 80.1 fl (ref 78.0–100.0)
Monocytes Absolute: 0.3 10*3/uL (ref 0.1–1.0)
Monocytes Relative: 3.9 % (ref 3.0–12.0)
Neutro Abs: 4.9 10*3/uL (ref 1.4–7.7)
Neutrophils Relative %: 69.2 % (ref 43.0–77.0)
Platelets: 173 10*3/uL (ref 150.0–400.0)
RBC: 4.8 Mil/uL (ref 3.87–5.11)
RDW: 15.1 % (ref 11.5–15.5)
WBC: 7 10*3/uL (ref 4.0–10.5)

## 2021-02-20 LAB — LIPID PANEL
Cholesterol: 235 mg/dL — ABNORMAL HIGH (ref 0–200)
HDL: 75.4 mg/dL (ref >39.00)
LDL Cholesterol: 143 mg/dL — ABNORMAL HIGH (ref 0–99)
NonHDL: 159.87
Total CHOL/HDL Ratio: 3
Triglycerides: 83 mg/dL (ref 0.0–149.0)
VLDL: 16.6 mg/dL (ref 0.0–40.0)

## 2021-02-20 LAB — FOLATE: Folate: 21.9 ng/mL (ref >5.9)

## 2021-02-20 LAB — VITAMIN B12: Vitamin B-12: 701 pg/mL (ref 211–911)

## 2021-02-20 LAB — TSH: TSH: 1.25 u[IU]/mL (ref 0.35–5.50)

## 2021-02-20 NOTE — Progress Notes (Signed)
Subjective:  Patient ID: Andrea Snyder, female    DOB: 06-Mar-1960  Age: 60 y.o. MRN: [id number]  CC: Annual Exam  This visit occurred during the SARS-CoV-2 public health emergency.  Safety protocols were in place, including screening questions prior to the visit, additional usage of staff PPE, and extensive cleaning of exam room while observing appropriate contact time as indicated for disinfecting solutions.    HPI Andrea Snyder presents for a CPX and to establish.  She complains of a 1 year history of paresthesias in her extremities and the development of urinary incontinence.  She was recently treated for UTI but says the urinary incontinence did not improve.  She denies headache, blurred vision, neck pain, low back pain, slurred speech, or ataxia.  Recent labs revealed that she is anemic.  History Andrea Snyder has a past medical history of Cervical rib and UTI (urinary tract infection).   She has no past surgical history on file.   Her family history includes Cancer in her mother; Diabetes in her father and mother; Heart failure in her father and mother; Hypertension in her father and mother; Stroke in her maternal grandmother.She reports that she quit smoking about 15 months ago. Her smoking use included cigars and cigarettes. She started smoking about 42 years ago. She has a 40.00 pack-year smoking history. She has never used smokeless tobacco. She reports that she does not currently use alcohol. She reports that she does not use drugs.  Outpatient Medications Prior to Visit  Medication Sig Dispense Refill   calcium gluconate 500 MG tablet Take 1 tablet by mouth 3 (three) times daily.     ibuprofen (ADVIL) 400 MG tablet Take 1 tablet (400 mg total) by mouth every 6 (six) hours as needed. 30 tablet 0   Multiple Vitamins-Minerals (MULTIVITAMIN WITH MINERALS) tablet Take 1 tablet by mouth daily.     dicyclomine (BENTYL) 20 MG tablet Take 1 tablet (20 mg total) by mouth 2 (two) times daily. 20  tablet 0   nitrofurantoin, macrocrystal-monohydrate, (MACROBID) 100 MG capsule Take 1 capsule (100 mg total) by mouth 2 (two) times daily. 10 capsule 0   nortriptyline (PAMELOR) 10 MG capsule Take 1 capsule (10 mg total) by mouth at bedtime. 30 capsule 3   Omega-3 Fatty Acids (FISH OIL PO) Take 1 capsule by mouth daily.     phenazopyridine (PYRIDIUM) 200 MG tablet Take 1 tablet (200 mg total) by mouth 3 (three) times daily as needed for pain. 6 tablet 0   No facility-administered medications prior to visit.    ROS Review of Systems  Constitutional: Negative.  Negative for diaphoresis and fatigue.  HENT: Negative.    Eyes: Negative.  Negative for visual disturbance.  Respiratory:  Negative for cough, chest tightness, shortness of breath and wheezing.   Cardiovascular:  Negative for chest pain, palpitations and leg swelling.  Gastrointestinal:  Negative for abdominal pain, constipation, diarrhea, nausea and vomiting.  Endocrine: Negative.   Genitourinary: Negative.  Negative for difficulty urinating, flank pain and hematuria.  Musculoskeletal:  Negative for arthralgias, back pain, myalgias and neck pain.  Skin: Negative.   Neurological:  Positive for weakness and numbness. Negative for dizziness, tremors, seizures, syncope, facial asymmetry, speech difficulty, light-headedness and headaches.  Hematological:  Negative for adenopathy. Does not bruise/bleed easily.  Psychiatric/Behavioral: Negative.     Objective:  BP 138/84 (BP Location: Right Arm, Patient Position: Sitting, Cuff Size: Large)    Pulse 87    Temp 98.6 F (37 C) (  Oral)    Ht 5' 6.5" (1.689 m)    Wt 139 lb (63 kg)    LMP 01/11/2012    SpO2 99%    BMI 22.10 kg/m   Physical Exam Vitals reviewed.  HENT:     Nose: Nose normal.     Mouth/Throat:     Mouth: Mucous membranes are moist.  Eyes:     General: No scleral icterus.    Conjunctiva/sclera: Conjunctivae normal.     Pupils: Pupils are equal, round, and reactive to  light.  Cardiovascular:     Rate and Rhythm: Normal rate and regular rhythm.     Pulses:          Carotid pulses are 1+ on the right side and 1+ on the left side.      Radial pulses are 1+ on the right side and 1+ on the left side.       Femoral pulses are 1+ on the right side and 1+ on the left side.      Popliteal pulses are 1+ on the right side and 1+ on the left side.       Dorsalis pedis pulses are 1+ on the right side and 1+ on the left side.       Posterior tibial pulses are 1+ on the right side and 1+ on the left side.     Heart sounds: No murmur heard. Pulmonary:     Effort: Pulmonary effort is normal.     Breath sounds: No stridor. No wheezing, rhonchi or rales.  Abdominal:     General: Abdomen is flat.     Palpations: There is no mass.     Tenderness: There is no abdominal tenderness. There is no guarding.     Hernia: No hernia is present.  Musculoskeletal:        General: Normal range of motion.     Cervical back: Neck supple.     Right lower leg: No edema.     Left lower leg: No edema.  Lymphadenopathy:     Cervical: No cervical adenopathy.  Skin:    General: Skin is warm and dry.     Findings: No rash.  Neurological:     Mental Status: She is alert.     Cranial Nerves: Cranial nerves 2-12 are intact.     Sensory: Sensory deficit present.     Motor: Weakness present.     Coordination: Coordination is intact.     Gait: Gait is intact.     Deep Tendon Reflexes: Reflexes normal.     Reflex Scores:      Tricep reflexes are 1+ on the right side and 1+ on the left side.      Bicep reflexes are 1+ on the right side and 1+ on the left side.      Brachioradialis reflexes are 1+ on the right side and 1+ on the left side.      Patellar reflexes are 2+ on the right side and 2+ on the left side.      Achilles reflexes are 1+ on the right side and 1+ on the left side. Psychiatric:        Mood and Affect: Mood normal.        Behavior: Behavior normal.    Lab Results   Component Value Date   WBC 7.0 02/20/2021   HGB 12.4 02/20/2021   HCT 38.4 02/20/2021   PLT 173.0 02/20/2021   GLUCOSE 86 11/17/2020   CHOL  235 (H) 02/20/2021   TRIG 83.0 02/20/2021   HDL 75.40 02/20/2021   LDLCALC 143 (H) 02/20/2021   ALT 18 11/17/2020   AST 19 11/17/2020   NA 139 11/17/2020   K 3.5 11/17/2020   CL 104 11/17/2020   CREATININE 0.86 11/17/2020   BUN 16 11/17/2020   CO2 27 11/17/2020   TSH 1.25 02/20/2021     Assessment & Plan:   Kalman Shan was seen today for annual exam.  Diagnoses and all orders for this visit:  Encounter for general adult medical examination with abnormal findings- Exam completed, labs reviewed, vaccines reviewed-she refused a flu vaccine but agreed to a Tdap, cancer screenings addressed, patient education was given.  Deficiency anemia- She is no longer anemic and her vitamin levels are normal. -     CBC with Differential/Platelet; Future -     Vitamin B1; Future -     Zinc; Future -     Vitamin B12; Future -     Folate; Future -     Reticulocytes; Future -     Methylmalonic acid, serum; Future -     Methylmalonic acid, serum -     Reticulocytes -     Folate -     Vitamin B12 -     Zinc -     Vitamin B1 -     CBC with Differential/Platelet  Colon cancer screening -     Ambulatory referral to Gastroenterology  Visit for screening mammogram -     MM DIGITAL SCREENING BILATERAL; Future  Screening for cervical cancer -     Ambulatory referral to Gynecology  Hyperlipidemia LDL goal <130- Statin therapy is not indicated. -     Lipid panel; Future -     TSH; Future -     TSH -     Lipid panel  Tobacco abuse, in remission -     Ambulatory Referral for Lung Cancer Scre  Dysuria- I will recheck her UA and urine culture. -     Urinalysis, Routine w reflex microscopic; Future -     CULTURE, URINE COMPREHENSIVE; Future  Paresthesias- Labs are negative for secondary causes.  I have asked her to see neurology to be evaluated for  demyelinating disease. -     Ambulatory referral to Neurology  Other orders -     Tdap vaccine greater than or equal to 7yo IM   I have discontinued Ifeoma M. Deroche's Omega-3 Fatty Acids (FISH OIL PO), nortriptyline, dicyclomine, phenazopyridine, and nitrofurantoin (macrocrystal-monohydrate). I am also having her maintain her ibuprofen, multivitamin with minerals, and calcium gluconate.  No orders of the defined types were placed in this encounter.    Follow-up: Return in about 3 months (around 05/21/2021).  Scarlette Calico, MD

## 2021-02-20 NOTE — Patient Instructions (Signed)

## 2021-02-21 DIAGNOSIS — R202 Paresthesia of skin: Secondary | ICD-10-CM | POA: Insufficient documentation

## 2021-02-25 ENCOUNTER — Telehealth: Payer: Self-pay | Admitting: Internal Medicine

## 2021-02-25 NOTE — Telephone Encounter (Signed)
Patient requesting rx for UTI  Advised patient labs for urinalysis and urine culture has been ordered by provider  Patient states she will go for labs tomorrow 12-28 at [address]

## 2021-02-26 LAB — METHYLMALONIC ACID, SERUM: Methylmalonic Acid, Quant: 133 nmol/L (ref 87–318)

## 2021-02-26 LAB — VITAMIN B1: Vitamin B1 (Thiamine): 23 nmol/L (ref 8–30)

## 2021-02-26 LAB — ZINC: Zinc: 75 ug/dL (ref 60–130)

## 2021-02-26 LAB — RETICULOCYTES
ABS Retic: 48200 cells/uL (ref 20000–80000)
Retic Ct Pct: 1 %

## 2021-02-27 ENCOUNTER — Encounter: Payer: Self-pay | Admitting: Internal Medicine

## 2021-03-10 NOTE — Telephone Encounter (Signed)
Patient informing provider her mri appt w/ [facility] was rescheduled from 02-27-2021 to 03-11-2021

## 2021-04-08 ENCOUNTER — Other Ambulatory Visit: Payer: Self-pay

## 2021-04-08 ENCOUNTER — Ambulatory Visit
Admission: RE | Admit: 2021-04-08 | Discharge: 2021-04-08 | Disposition: A | Discharge status: Home / Self Care | Payer: No Typology Code available for payment source | Source: Ambulatory Visit | Attending: Internal Medicine | Admitting: Internal Medicine

## 2021-04-08 DIAGNOSIS — Z1231 Encounter for screening mammogram for malignant neoplasm of breast: Secondary | ICD-10-CM

## 2021-04-09 ENCOUNTER — Ambulatory Visit: Payer: No Typology Code available for payment source | Admitting: Nurse Practitioner

## 2021-04-21 ENCOUNTER — Other Ambulatory Visit: Payer: Self-pay | Admitting: *Deleted

## 2021-04-21 DIAGNOSIS — Z87891 Personal history of nicotine dependence: Secondary | ICD-10-CM

## 2021-04-29 ENCOUNTER — Ambulatory Visit: Payer: No Typology Code available for payment source | Admitting: Neurology

## 2021-04-29 ENCOUNTER — Encounter: Payer: Self-pay | Admitting: Neurology

## 2021-04-29 VITALS — BP 122/82 | HR 81 | Ht 66.5 in | Wt 141.0 lb

## 2021-04-29 DIAGNOSIS — R269 Unspecified abnormalities of gait and mobility: Secondary | ICD-10-CM | POA: Insufficient documentation

## 2021-04-29 DIAGNOSIS — R202 Paresthesia of skin: Secondary | ICD-10-CM | POA: Insufficient documentation

## 2021-04-29 NOTE — Progress Notes (Signed)
Chief Complaint  Patient presents with   New Patient (Initial Visit)    Rm 15. PCP is Dr. Scarlette Calico. Accompanied by son, Tyrell. NP internal referral for paresthesias. Pt c/o numbness in feet and entire left side. Pt says movement helps symptoms. States she has bulging disc in lower back.      ASSESSMENT AND PLAN  Andrea Snyder is a 61 y.o. female   Gradual onset, slow worsening of left-sided paresthesia, clumsiness, followed by right-sided involvement's following her COVID infection in July 2021,  On examination, she has hyperreflexia of patellar, absent ankle reflex, length dependent sensory changes,, ataxic gait,left heel-to-shin dysmetria  Need to rule out right brainstem/cerebellum pathology versus left upper cervical pathology  MRI of the brain, cervical spine with without contrast  EMG nerve conduction study to rule out superimposed peripheral pathology  Laboratory evaluations  DIAGNOSTIC DATA (LABS, IMAGING, TESTING) - I reviewed patient records, labs, notes, testing and imaging myself where available.   MEDICAL HISTORY:  Andrea Snyder, is a 61 year old female, accompanied by her son, seen in request by her primary care doctor Scarlette Calico for evaluation of gait abnormality, initial evaluation was on April 29, 2021.  I reviewed and summarized the referring note. PMHX.  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  PHYSICAL EXAMNIATION:  Gen: NAD, conversant, well nourised, well groomed                     Cardiovascular: Regular rate rhythm, no peripheral edema, warm, nontender. Eyes: Conjunctivae clear without exudates or hemorrhage Neck: Supple, no carotid bruits. Pulmonary: Clear to auscultation bilaterally   NEUROLOGICAL EXAM:  MENTAL STATUS: Speech:    Speech is normal; fluent and spontaneous with normal comprehension.  Cognition:     Orientation to time, place and person     Normal recent and remote memory     Normal Attention span and concentration     Normal Language, naming, repeating,spontaneous speech     Fund of knowledge   CRANIAL NERVES: CN II: Visual fields are full to confrontation. Pupils are round equal and briskly reactive to light. CN III, IV, VI: extraocular movement are normal. No ptosis. CN V: Facial sensation is intact to light touch CN VII: Face is symmetric with normal eye closure  CN VIII: Hearing is normal to causal conversation. CN IX, X: Phonation is normal. CN XI: Head turning and shoulder shrug are intact  MOTOR: Variable effort on examination, mild fixation of left upper extremity on rapid rotating movement, giveaway weakness of bilateral lower extremity proximal muscle examination, and the left ankle dorsiflexion,  REFLEXES: Reflexes are 2+ and symmetric at the  biceps, triceps, knees, and absent at ankles. Plantar responses are mute bilaterally  SENSORY: Mildly length dependent decreased light touch, pinprick to bilateral ankle level, left worse than right,  COORDINATION: There is no trunk or limb dysmetria noted.  GAIT/STANCE: She can get up from seated position arm crossed, wide-based,  stiff, ataxic gait, positive Romberg signs,  REVIEW OF SYSTEMS:  Full 14 system review of systems performed and notable only for as above All other review of systems were negative.   ALLERGIES: Allergies  Allergen Reactions   Penicillins Itching   Percocet [Oxycodone-Acetaminophen] Itching and Nausea And Vomiting    HOME MEDICATIONS: Current Outpatient Medications  Medication Sig Dispense Refill   calcium gluconate 500 MG tablet Take 1 tablet by mouth 3 (three) times daily.     ibuprofen (ADVIL) 400 MG tablet Take 1 tablet (400 mg total) by mouth every 6 (six) hours as needed. 30 tablet 0   methocarbamol (ROBAXIN) 500 MG tablet Take 1 tablet by mouth 3 (three) times daily as needed.     Multiple Vitamins-Minerals (MULTIVITAMIN WITH MINERALS) tablet Take 1 tablet by mouth daily.     No current facility-administered medications for this visit.    PAST MEDICAL HISTORY: Past Medical History:  Diagnosis Date   Cervical rib    UTI (urinary tract infection)     PAST SURGICAL HISTORY: History reviewed. No pertinent surgical history.  FAMILY HISTORY: Family History  Problem Relation Age of Onset   Diabetes Mother    Heart failure Mother    Hypertension Mother    Cancer Mother    Diabetes Father    Heart failure Father    Hypertension Father    Stroke Maternal Grandmother     SOCIAL HISTORY: Social History   Socioeconomic History   Marital status: Married    Spouse name: Not on file   Number of children: Not on file   Years of education: Not on file   Highest education level: Not on file  Occupational History   Not on file  Tobacco Use   Smoking status: Former    Packs/day: 1.00    Years: 40.00    Pack years: 40.00    Types: Cigars, Cigarettes    Start date: 12/28/1978    Quit date: 11/22/2019    Years since quitting: 1.4   Smokeless tobacco: Never   Tobacco comments:    3 cigarrettes in 24 hour period - trying to quit  Vaping Use   Vaping Use: Never used   Substance and Sexual Activity   Alcohol use: Not Currently    Comment: OCCASIONAL   Drug use: No   Sexual activity: Yes    Partners: Male  Other Topics Concern   Not on file  Social History Narrative   Caffeine - tea 3 times week   Coffee 1 cup / day      4 year bachelors       Walks for exercise      1 level home alone   Social Determinants of Health   Financial Resource Strain: Not on file  Food Insecurity: Not on file  Transportation Needs: Not on file  Physical Activity: Not on file  Stress: Not on file  Social Connections: Not on file  Intimate Partner Violence: Not on file      Marcial Pacas, M.D. Ph.D.  [facility] [address], [place], [place] [address] Ph: [phone] Fax: [phone]  CC:  Janith Lima, MD [address]  [place],  [place] [address]  Janith Lima, MD

## 2021-05-02 ENCOUNTER — Ambulatory Visit (INDEPENDENT_AMBULATORY_CARE_PROVIDER_SITE_OTHER): Payer: No Typology Code available for payment source | Admitting: Acute Care

## 2021-05-02 ENCOUNTER — Other Ambulatory Visit: Payer: Self-pay

## 2021-05-02 DIAGNOSIS — Z87891 Personal history of nicotine dependence: Secondary | ICD-10-CM | POA: Diagnosis not present

## 2021-05-02 LAB — DRUG SCREEN 10 W/CONF, SERUM
Amphetamines, IA: NEGATIVE ng/mL
Barbiturates, IA: NEGATIVE ug/mL
Benzodiazepines, IA: NEGATIVE ng/mL
Cocaine & Metabolite, IA: NEGATIVE ng/mL
Methadone, IA: NEGATIVE ng/mL
Opiates, IA: NEGATIVE ng/mL
Oxycodones, IA: NEGATIVE ng/mL
Phencyclidine, IA: NEGATIVE ng/mL
Propoxyphene, IA: NEGATIVE ng/mL
THC(Marijuana) Metabolite, IA: NEGATIVE ng/mL

## 2021-05-02 LAB — ETHANOL

## 2021-05-02 LAB — SEDIMENTATION RATE: Sed Rate: 18 mm/hr (ref 0–40)

## 2021-05-02 LAB — C-REACTIVE PROTEIN: CRP: <1 mg/L (ref 0–10)

## 2021-05-02 LAB — CK: Total CK: 195 U/L — ABNORMAL HIGH (ref 32–182)

## 2021-05-02 LAB — HIV ANTIBODY (ROUTINE TESTING W REFLEX): HIV Screen 4th Generation wRfx: NONREACTIVE

## 2021-05-02 LAB — TSH: TSH: 1.77 u[IU]/mL (ref 0.450–4.500)

## 2021-05-02 LAB — HGB A1C W/O EAG: Hgb A1c MFr Bld: 5.9 % — ABNORMAL HIGH (ref 4.8–5.6)

## 2021-05-02 LAB — ANA W/REFLEX IF POSITIVE: Anti Nuclear Antibody (ANA): NEGATIVE

## 2021-05-02 LAB — RPR: RPR Ser Ql: NONREACTIVE

## 2021-05-02 NOTE — Patient Instructions (Signed)
Thank you for participating in the [facility] Cancer Screening Program. °It was our pleasure to meet you today. °We will call you with the results of your scan within the next few days. °Your scan will be assigned a Lung RADS category score by the physicians reading the scans.  °This Lung RADS score determines follow up scanning.  °See below for description of categories, and follow up screening recommendations. °We will be in touch to schedule your follow up screening annually or based on recommendations of our providers. °We will fax a copy of your scan results to your Primary Care Physician, or the physician who referred you to the program, to ensure they have the results. °Please call the office if you have any questions or concerns regarding your scanning experience or results.  °Our office number is [phone]. °Please speak with Denise Phelps, RN. She is our Lung Cancer Screening RN. °If she is unavailable when you call, please have the office staff send her a message. She will return your call at her earliest convenience. °Remember, if your scan is normal, we will scan you annually as long as you continue to meet the criteria for the program. (Age 55-77, Current smoker or smoker who has quit within the last 15 years). °If you are a smoker, remember, quitting is the single most powerful action that you can take to decrease your risk of lung cancer and other pulmonary, breathing related problems. °We know quitting is hard, and we are here to help.  °Please let us know if there is anything we can do to help you meet your goal of quitting. °If you are a former smoker, congratulations. We are proud of you! Remain smoke free! °Remember you can refer friends or family members through the number above.  °We will screen them to make sure they meet criteria for the program. °Thank you for helping us take better care of you by participating in Lung Screening. ° °You can receive free nicotine replacement therapy  ( patches, gum or mints) by calling 1-800-QUIT NOW. Please call so we can get you on the path to becoming  a non-smoker. I know it is hard, but you can do this! ° °Lung RADS Categories: ° °Lung RADS 1: no nodules or definitely non-concerning nodules.  °Recommendation is for a repeat annual scan in 12 months. ° °Lung RADS 2:  nodules that are non-concerning in appearance and behavior with a very low likelihood of becoming an active cancer. °Recommendation is for a repeat annual scan in 12 months. ° °Lung RADS 3: nodules that are probably non-concerning , includes nodules with a low likelihood of becoming an active cancer.  Recommendation is for a 6-month repeat screening scan. Often noted after an upper respiratory illness. We will be in touch to make sure you have no questions, and to schedule your 6-month scan. ° °Lung RADS 4 A: nodules with concerning findings, recommendation is most often for a follow up scan in 3 months or additional testing based on our provider's assessment of the scan. We will be in touch to make sure you have no questions and to schedule the recommended 3 month follow up scan. ° °Lung RADS 4 B:  indicates findings that are concerning. We will be in touch with you to schedule additional diagnostic testing based on our provider's  assessment of the scan. ° °Hypnosis for smoking cessation  °[employer] °[phone] ° °Acupuncture for smoking cessation  °[facility] °[phone]  °

## 2021-05-02 NOTE — Progress Notes (Signed)
Virtual Visit via Telephone Note ? ?I connected with Andrea Snyder on 01/14/21 at  2:00 PM EST by telephone and verified that I am speaking with the correct person using two identifiers. ? ?Location: ?Patient: Home ?Provider: Working from home ?  ?I discussed the limitations, risks, security and privacy concerns of performing an evaluation and management service by telephone and the availability of in person appointments. I also discussed with the patient that there may be a patient responsible charge related to this service. The patient expressed understanding and agreed to proceed. ? ?Shared Decision Making Visit Lung Cancer Screening Program ?([id number]) ? ? ?Eligibility: ?Age 61 y.o. ?Pack Years Smoking History Calculation 20 ?(# packs/per year x # years smoked) ?Recent History of coughing up blood  no ?Unexplained weight loss? no ?( >Than 15 pounds within the last 6 months ) ?Prior History Lung / other cancer no ?(Diagnosis within the last 5 years already requiring surveillance chest CT Scans). ?Smoking Status Former Smoker ?Former Smokers: Years since quit: 2 years ? Quit Date: 11/2019 ? ?Visit Components: ?Discussion included one or more decision making aids. yes ?Discussion included risk/benefits of screening. yes ?Discussion included potential follow up diagnostic testing for abnormal scans. yes ?Discussion included meaning and risk of over diagnosis. yes ?Discussion included meaning and risk of False Positives. yes ?Discussion included meaning of total radiation exposure. yes ? ?Counseling Included: ?Importance of adherence to annual lung cancer LDCT screening. yes ?Impact of comorbidities on ability to participate in the program. yes ?Ability and willingness to under diagnostic treatment. yes ? ?Smoking Cessation Counseling: ?Current Smokers:  ?Discussed importance of smoking cessation. yes ?Information about tobacco cessation classes and interventions provided to patient. yes ?Patient provided with "ticket"  for LDCT Scan. yes ?Symptomatic Patient. no ? Counseling NA ?Diagnosis Code: Tobacco Use Z72.0 ?Asymptomatic Patient yes ? Counseling (Intermediate counseling: > three minutes counseling) G0174 ?Former Smokers:  ?Discussed the importance of maintaining cigarette abstinence. yes ?Diagnosis Code: Personal History of Nicotine Dependence. B44.967 ?Information about tobacco cessation classes and interventions provided to patient. Yes ?Patient provided with "ticket" for LDCT Scan. yes ?Written Order for Lung Cancer Screening with LDCT placed in Epic. Yes ?(CT Chest Lung Cancer Screening Low Dose W/O CM) [id number] ?Z12.2-Screening of respiratory organs ?Z87.891-Personal history of nicotine dependence ? ? ?I spent 25 minutes of face to face time with her discussing the risks and benefits of lung cancer screening. We viewed a power point together that explained in detail the above noted topics. We took the time to pause the power point at intervals to allow for questions to be asked and answered to ensure understanding. We discussed that she had taken the single most powerful action possible to decrease her risk of developing lung cancer when he quit smoking. I counseled her to remain smoke free, and to contact me if she ever had the desire to smoke again so that I can provide resources and tools to help support the effort to remain smoke free. We discussed the time and location of the scan, and that either  [name] RN or I will call with the results within  24-48 hours of receiving them. She has my card and contact information in the event she needs to speak with me, in addition to a copy of the power point we reviewed as a resource. She verbalized understanding of all of the above and had no further questions upon leaving the office.  ? ? ? ?I explained to the patient that there  has been a high incidence of coronary artery disease noted on these exams. I explained that this is a non-gated exam therefore degree or  severity cannot be determined. This patient is not on statin therapy. I have asked the patient to follow-up with their PCP regarding any incidental finding of coronary artery disease and management with diet or medication as they feel is clinically indicated. The patient verbalized understanding of the above and had no further questions. ? ? ?I spent 3 minutes counseling on smoking cessation and the health risks of continued tobacco abuse  ? ? ?Andrea Snyder D. Harris, NP-C ? Pulmonary & Critical Care ?Personal contact information can be found on Amion  ?05/02/2021, 10:13 AM ? ? ? ? ? ? ? ? ? ?

## 2021-05-05 ENCOUNTER — Ambulatory Visit (INDEPENDENT_AMBULATORY_CARE_PROVIDER_SITE_OTHER)
Admission: RE | Admit: 2021-05-05 | Discharge: 2021-05-05 | Disposition: A | Discharge status: Home / Self Care | Payer: No Typology Code available for payment source | Source: Ambulatory Visit | Attending: Acute Care | Admitting: Acute Care

## 2021-05-05 ENCOUNTER — Other Ambulatory Visit: Payer: Self-pay

## 2021-05-05 DIAGNOSIS — Z87891 Personal history of nicotine dependence: Secondary | ICD-10-CM | POA: Diagnosis not present

## 2021-05-06 ENCOUNTER — Other Ambulatory Visit: Payer: Self-pay | Admitting: Acute Care

## 2021-05-06 ENCOUNTER — Encounter: Payer: No Typology Code available for payment source | Admitting: Neurology

## 2021-05-06 ENCOUNTER — Telehealth: Payer: Self-pay | Admitting: Neurology

## 2021-05-06 DIAGNOSIS — Z87891 Personal history of nicotine dependence: Secondary | ICD-10-CM

## 2021-05-06 NOTE — Telephone Encounter (Signed)
Pt verified by name and DOB,  normal results given per provider, pt voiced understanding all question answered. °

## 2021-05-06 NOTE — Telephone Encounter (Signed)
Please call patient, laboratory evaluation showed mildly elevated A1c 5.9, indicating mild elevated glucose level over the past few months, she should start moderate exercise, diet control ? ?Mild elevated CPK 195 of unknown conical significance ? ?Rest of the laboratory evaluation showed no significant abnormalities. ? ?I have forwarded the lab to her primary care Janith Lima, MD  ?

## 2021-05-07 ENCOUNTER — Ambulatory Visit: Payer: No Typology Code available for payment source | Admitting: Medical

## 2021-05-08 ENCOUNTER — Encounter: Payer: Self-pay | Admitting: Obstetrics and Gynecology

## 2021-05-08 ENCOUNTER — Other Ambulatory Visit (HOSPITAL_COMMUNITY)
Admission: RE | Admit: 2021-05-08 | Discharge: 2021-05-08 | Disposition: A | Discharge status: Home / Self Care | Payer: No Typology Code available for payment source | Source: Ambulatory Visit | Attending: Obstetrics and Gynecology | Admitting: Obstetrics and Gynecology

## 2021-05-08 ENCOUNTER — Other Ambulatory Visit: Payer: Self-pay

## 2021-05-08 ENCOUNTER — Ambulatory Visit (INDEPENDENT_AMBULATORY_CARE_PROVIDER_SITE_OTHER): Payer: No Typology Code available for payment source | Admitting: Obstetrics and Gynecology

## 2021-05-08 VITALS — BP 150/91 | HR 84 | Ht 66.0 in | Wt 138.9 lb

## 2021-05-08 DIAGNOSIS — Z113 Encounter for screening for infections with a predominantly sexual mode of transmission: Secondary | ICD-10-CM | POA: Insufficient documentation

## 2021-05-08 DIAGNOSIS — Z780 Asymptomatic menopausal state: Secondary | ICD-10-CM

## 2021-05-08 DIAGNOSIS — Z01419 Encounter for gynecological examination (general) (routine) without abnormal findings: Secondary | ICD-10-CM

## 2021-05-08 NOTE — Progress Notes (Signed)
New GYN in office to establish care. She does not have any GYN concerns today. ? ?Last mammogram: 04-08-21 ?

## 2021-05-08 NOTE — Progress Notes (Signed)
? ? ?GYNECOLOGY ANNUAL PREVENTATIVE CARE ENCOUNTER NOTE ? ?History:    ? Andrea Snyder is a 61 y.o. [phone] female here for a routine annual gynecologic exam.  Current complaints: none.   Denies abnormal vaginal bleeding, discharge, pelvic pain, problems with intercourse or other gynecologic concerns. Pt does relate hx of left sided weakness/paresthesia ?  ?Gynecologic History ?Patient's last menstrual period was 01/11/2012. ?Contraception: post menopausal status ?Last Pap: done today. Results were: pending ?Last mammogram: 04/08/21. Results were: normal ? ?Obstetric History ?OB History  ?Gravida Para Term Preterm AB Living  ?'4 3     1 3  '$ ?SAB IAB Ectopic Multiple Live Births  ?           ?  ?# Outcome Date GA Lbr Len/2nd Weight Sex Delivery Anes PTL Lv  ?4 AB           ?3 Para           ?2 Para           ?1 Para           ? ? ?Past Medical History:  ?Diagnosis Date  ? Cervical rib   ? UTI (urinary tract infection)   ? ? ?History reviewed. No pertinent surgical history. ? ?Current Outpatient Medications on File Prior to Visit  ?Medication Sig Dispense Refill  ? calcium gluconate 500 MG tablet Take 1 tablet by mouth 3 (three) times daily.    ? ibuprofen (ADVIL) 400 MG tablet Take 1 tablet (400 mg total) by mouth every 6 (six) hours as needed. 30 tablet 0  ? methocarbamol (ROBAXIN) 500 MG tablet Take 1 tablet by mouth 3 (three) times daily as needed.    ? Multiple Vitamins-Minerals (MULTIVITAMIN WITH MINERALS) tablet Take 1 tablet by mouth daily.    ? ?No current facility-administered medications on file prior to visit.  ? ? ?Allergies  ?Allergen Reactions  ? Penicillins Itching  ? Percocet [Oxycodone-Acetaminophen] Itching and Nausea And Vomiting  ? ? ?Social History:  reports that she quit smoking about 17 months ago. Her smoking use included cigars and cigarettes. She started smoking about 42 years ago. She has a 40.00 pack-year smoking history. She has never used smokeless tobacco. She reports that she does not  currently use alcohol. She reports that she does not use drugs. ? ?Family History  ?Problem Relation Age of Onset  ? Diabetes Mother   ? Heart failure Mother   ? Hypertension Mother   ? Cancer Mother   ? Diabetes Father   ? Heart failure Father   ? Hypertension Father   ? Stroke Maternal Grandmother   ? ? ?The following portions of the patient's history were reviewed and updated as appropriate: allergies, current medications, past family history, past medical history, past social history, past surgical history and problem list. ? ?Review of Systems ?Pertinent items noted in HPI and remainder of comprehensive ROS otherwise negative. ? ?Physical Exam:  ?BP (!) 150/91   Pulse 84   Ht '5\' 6"'$  (1.676 m)   Wt 138 lb 14.4 oz (63 kg)   LMP 01/11/2012   BMI 22.42 kg/m?  ?CONSTITUTIONAL: Well-developed, well-nourished female in no acute distress.  ?HENT:  Normocephalic, atraumatic, External right and left ear normal. Oropharynx is clear and moist ?EYES: Conjunctivae and EOM are normal.  ?NECK: Normal range of motion, supple, no masses.  Normal thyroid.  ?SKIN: Skin is warm and dry. No rash noted. Not diaphoretic. No erythema. No pallor. ?MUSCULOSKELETAL: Normal range  of motion. No tenderness.  No cyanosis, clubbing, or edema.  2+ distal pulses. ?NEUROLOGIC: Alert and oriented to person, place, and time. Normal reflexes, muscle tone coordination.  ?PSYCHIATRIC: Normal mood and affect. Normal behavior. Normal judgment and thought content. ?CARDIOVASCULAR: Normal heart rate noted, regular rhythm ?RESPIRATORY: Clear to auscultation bilaterally. Effort and breath sounds normal, no problems with respiration noted. ?BREASTS: Symmetric in size. No masses, tenderness, skin changes, nipple drainage, or lymphadenopathy bilaterally. Performed in the presence of a chaperone. ?ABDOMEN: Soft, no distention noted.  No tenderness, rebound or guarding.  ?PELVIC: Normal appearing external genitalia and urethral meatus; normal appearing  vaginal mucosa and cervix.  No abnormal discharge noted.  Pap smear obtained.  Normal uterine size, no other palpable masses, no uterine or adnexal tenderness. Uterus retroflexed, vagina and cervix appeared atrophic .  Some cervical stenosis noted, cervix friable likely from hypoestrogen.Performed in the presence of a chaperone. ?  ?Assessment and Plan:  ?  1. Women's annual routine gynecological examination ?Normal annual exam ?Follow up in 1 year ? ?BP elevated, pt urged to follow up with her PCP ?Hgb A1c was 5.9 ? ?- Cytology - PAP( [place]) ? ?2. Routine screening for STI (sexually transmitted infection) ? ?- Cervicovaginal ancillary only( [place]) ?- Hepatitis C Antibody ? ?3. Menopause ?Some vaginal atrophy noted, pt notes vasomotor symptoms but notes they are mild and she does not need medication. ?Consider bone density scan in 1-2 years ? ?Will follow up results of pap smear and manage accordingly. ?Mammogram scheduled ?Routine preventative health maintenance measures emphasized. ?Please refer to After Visit Summary for other counseling recommendations.  ?   ? ?Lynnda Shields, MD, FACOG ?Obstetrician Social research officer, government, Faculty Practice ?[facility]  ?

## 2021-05-09 LAB — CYTOLOGY - PAP
Adequacy: ABSENT
Comment: NEGATIVE
Diagnosis: NEGATIVE
High risk HPV: NEGATIVE

## 2021-05-09 LAB — HEPATITIS C ANTIBODY: Hep C Virus Ab: NONREACTIVE

## 2021-05-09 LAB — CERVICOVAGINAL ANCILLARY ONLY
Comment: NEGATIVE
Trichomonas: NEGATIVE

## 2021-05-12 ENCOUNTER — Encounter: Payer: No Typology Code available for payment source | Admitting: Neurology

## 2021-05-12 ENCOUNTER — Telehealth: Payer: Self-pay | Admitting: Neurology

## 2021-05-12 ENCOUNTER — Telehealth: Payer: Self-pay

## 2021-05-12 NOTE — Telephone Encounter (Signed)
unable to leave a vmail the mail box is full [employer] [place] Gemma rule auth: [place] Ref # [id number] ?

## 2021-05-12 NOTE — Telephone Encounter (Signed)
Patient had to cancel NCV/EMG appointment today d/t being sick. She called back to reschedule the appointment, unfortunately there are currently no appointments available. I advised that I would reach out to you to see how you would like to proceed.  ? ?Please advise, ? ?Thanks! ?
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
## 2021-05-13 NOTE — Telephone Encounter (Signed)
Put her on March 27th, Andrea Snyder has few slots open, I will work with her inbetween patient ?

## 2021-05-19 NOTE — Telephone Encounter (Signed)
Phone rep was advised by [place] that she doesn't have any available and that Dr Krista Blue may want to refer pt out.  ?

## 2021-05-19 NOTE — Telephone Encounter (Signed)
Pt is asking for a call back for the re scheduling of her NCS/EMG ?

## 2021-05-19 NOTE — Telephone Encounter (Signed)
She is on schedule for March 27th, make sure that she is on schedule for MRIs ?

## 2021-05-20 NOTE — Telephone Encounter (Signed)
Noted, I called her on 05/12/21 and I was unable to leave a voicemail due to the voicemail box was full. I will try again later today.  ?

## 2021-05-20 NOTE — Telephone Encounter (Signed)
LVM for pt to call back to schedule.

## 2021-05-20 NOTE — Telephone Encounter (Signed)
MR Brain w/wo contrast & MR Cervical spine w/wo contrast Dr. Krista Blue [facility] Josem Kaufmann: [place] Ref # [id number]. Patient is scheduled at [facility] for 05/21/21.  ?

## 2021-05-20 NOTE — Telephone Encounter (Signed)
MR Brain w/wo contrast & MR Cervical spine w/wo contrast Dr. Krista Blue [facility] Josem Kaufmann: [place] Ref # [id number]. Patient is scheduled at [facility] for 05/21/21.  ?

## 2021-05-21 ENCOUNTER — Ambulatory Visit: Payer: No Typology Code available for payment source

## 2021-05-21 ENCOUNTER — Other Ambulatory Visit: Payer: Self-pay

## 2021-05-21 NOTE — Telephone Encounter (Signed)
The patient came in and stated she did not want to have this MRI at this time and that she wanted to speak with Dr. Krista Blue at her appointment with her on Monday with her NCV/EMG  ?

## 2021-05-25 DIAGNOSIS — M47816 Spondylosis without myelopathy or radiculopathy, lumbar region: Secondary | ICD-10-CM | POA: Insufficient documentation

## 2021-05-26 ENCOUNTER — Telehealth: Payer: Self-pay | Admitting: Neurology

## 2021-05-26 ENCOUNTER — Ambulatory Visit (INDEPENDENT_AMBULATORY_CARE_PROVIDER_SITE_OTHER): Payer: 59 | Admitting: Neurology

## 2021-05-26 ENCOUNTER — Other Ambulatory Visit: Payer: Self-pay

## 2021-05-26 DIAGNOSIS — R202 Paresthesia of skin: Secondary | ICD-10-CM

## 2021-05-26 DIAGNOSIS — R269 Unspecified abnormalities of gait and mobility: Secondary | ICD-10-CM | POA: Diagnosis not present

## 2021-05-26 NOTE — Telephone Encounter (Signed)
Request made today  

## 2021-05-26 NOTE — Progress Notes (Signed)
? ?No chief complaint on file. ? ? ? ? ?ASSESSMENT AND PLAN ? ?Andrea Snyder is a 61 y.o. female   ?Gradual onset, slow worsening of left-sided paresthesia, clumsiness, followed by right-sided involvement's following her COVID infection in July 2021, ? On examination, she has hyperreflexia of both upper and lower extremity, wide-based unsteady gait, ? need to rule out right brainstem/cerebellum pathology versus left upper cervical/thoracic pathology ? Will get record MRI of the brain, cervical spine/thoracic spine from [facility] ? EMG nerve conduction study is normal, no evidence of peripheral nervous system disease found ? Extensive laboratory evaluation showed no etiology to explain her complaints, ? ?DIAGNOSTIC DATA (LABS, IMAGING, TESTING) ?- I reviewed patient records, labs, notes, testing and imaging myself where available. ? ? ?MEDICAL HISTORY: ? ?Andrea Snyder, is a 61 year old female, accompanied by her son, seen in request by her primary care doctor Scarlette Calico for evaluation of gait abnormality, initial evaluation was on April 29, 2021. ? ?I reviewed and summarized the referring note. PMHX. ? ?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, lost sense of smell, taste, cough, fever, difficulty breathing for about a month, ? ?Following that infection in August 2021, she began to notice unsteady gait, left foot numbness, tingling, 2 weeks later began to involving left arm, numbness tingling and clumsiness, by January 2022, she noticed right side foot numbness, and slight right hand numbness, and worsening gait difficulty by then, she also reported loss of bladder control, her symptoms plateaued since January 2022,  ? 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 ?She denies left facial involvement, denies dysarthria, ? ?Update May 22, 2021: ? ?Reviewed extensive laboratory evaluations in February 2022, normal negative folic acid, Z61,  methylmalonic acid level, zinc, B1, TSH, CBC, RPR, HIV, CPK, ANA, drug screen, LDL 143, cholesterol 235, A1c 5.9 ? ?EMG nerve conduction study today is essentially normal, no evidence of peripheral neuropathy, bilateral lumbar radiculopathy, cervical radiculopathy. ? ?Patient is apparently very frustrated by her symptoms, continued complaints of significant gait abnormality, incontinence ? ?Reported that she had extensive MRI scan at [facility], does not want to go through the ordered MRI of the brain and cervical thoracic spine, will get record ? ? ?PHYSICAL EXAM: ? ?PHYSICAL EXAMNIATION: ? ?Gen: NAD, conversant, well nourised, well groomed      ? ? ?NEUROLOGICAL EXAM: ? ?MENTAL STATUS: ?Speech/cognition: ?Awake, alert, oriented to history taking and casual conversation ? ?CRANIAL NERVES: ?CN II: Visual fields are full to confrontation. Pupils are round equal and briskly reactive to light. ?CN III, IV, VI: extraocular movement are normal. No ptosis. ?CN V: Facial sensation is intact to light touch ?CN VII: Face is symmetric with normal eye closure  ?CN VIII: Hearing is normal to causal conversation. ?CN IX, X: Phonation is normal. ?CN XI: Head turning and shoulder shrug are intact ? ?MOTOR: Variable effort on examination, mild fixation of left upper extremity on rapid rotating movement, giveaway weakness of bilateral lower extremity proximal muscle examination, and the left ankle dorsiflexion, felt there was no significant bilateral upper and lower extremity proximal and distal muscle weakness ? ?REFLEXES: ?Reflexes are 2+ and symmetric at the biceps, triceps, knees, and absent at ankles. Plantar responses are mute bilaterally ? ?SENSORY: Mildly length dependent decreased light touch, pinprick to bilateral ankle level, left worse than right, ? ?COORDINATION: ?There is no trunk or limb dysmetria noted. ? ?GAIT/STANCE: She can get up from seated position arm crossed,  wide-based, stiff, ataxic gait,  ? ? ?REVIEW OF  SYSTEMS:  ?Full 14 system review of systems performed and notable only for as above ?All other review of systems were negative. ? ? ?ALLERGIES: ?Allergies  ?Allergen Reactions  ? Penicillins Itching  ? Percocet [Oxycodone-Acetaminophen] Itching and Nausea And Vomiting  ? ? ?HOME MEDICATIONS: ?Current Outpatient Medications  ?Medication Sig Dispense Refill  ? calcium gluconate 500 MG tablet Take 1 tablet by mouth 3 (three) times daily.    ? ibuprofen (ADVIL) 400 MG tablet Take 1 tablet (400 mg total) by mouth every 6 (six) hours as needed. 30 tablet 0  ? methocarbamol (ROBAXIN) 500 MG tablet Take 1 tablet by mouth 3 (three) times daily as needed.    ? Multiple Vitamins-Minerals (MULTIVITAMIN WITH MINERALS) tablet Take 1 tablet by mouth daily.    ? ?No current facility-administered medications for this visit.  ? ? ?PAST MEDICAL HISTORY: ?Past Medical History:  ?Diagnosis Date  ? Cervical rib   ? UTI (urinary tract infection)   ? ? ?PAST SURGICAL HISTORY: ?No past surgical history on file. ? ?FAMILY HISTORY: ?Family History  ?Problem Relation Age of Onset  ? Diabetes Mother   ? Heart failure Mother   ? Hypertension Mother   ? Cancer Mother   ? Diabetes Father   ? Heart failure Father   ? Hypertension Father   ? Stroke Maternal Grandmother   ? ? ?SOCIAL HISTORY: ?Social History  ? ?Socioeconomic History  ? Marital status: Married  ?  Spouse name: Not on file  ? Number of children: Not on file  ? Years of education: Not on file  ? Highest education level: Not on file  ?Occupational History  ? Not on file  ?Tobacco Use  ? Smoking status: Former  ?  Packs/day: 1.00  ?  Years: 40.00  ?  Pack years: 40.00  ?  Types: Cigars, Cigarettes  ?  Start date: 12/28/1978  ?  Quit date: 11/22/2019  ?  Years since quitting: 1.5  ? Smokeless tobacco: Never  ? Tobacco comments:  ?  3 cigarrettes in 24 hour period - trying to quit  ?Vaping Use  ? Vaping Use: Never used  ?Substance and Sexual Activity  ? Alcohol use: Not Currently  ?   Comment: OCCASIONAL  ? Drug use: No  ? Sexual activity: Not Currently  ?  Partners: Male  ?Other Topics Concern  ? Not on file  ?Social History Narrative  ? Caffeine - tea 3 times week  ? Coffee 1 cup / day  ?   ? 4 year bachelors   ?   ? Walks for exercise  ?   ? 1 level home alone  ? ?Social Determinants of Health  ? ?Financial Resource Strain: Not on file  ?Food Insecurity: Not on file  ?Transportation Needs: Not on file  ?Physical Activity: Not on file  ?Stress: Not on file  ?Social Connections: Not on file  ?Intimate Partner Violence: Not on file  ? ? ? ? ?Marcial Pacas, M.D. Ph.D. ? ?[facility] ?[address], [address] ?[place], [place] [address] ?Ph: [phone]) [phone] ?Fax: [phone] ? ?CC:  Janith Lima, MD ?[place],  [place] [address]  Janith Lima, MD   ?

## 2021-05-26 NOTE — Procedures (Signed)
? ? ? ?   ?Full Name: Andrea Snyder Gender: Female ?MRN #: [id number] Date of Birth: June 06, 1960 ?   ?Visit Date: 05/26/2021 07:27 ?Age: 61 Years ?Examining Physician: Marcial Pacas, MD  ?Referring Physician: Marcial Pacas, MD ?Height: 5 feet 6 inch ?Patient History: 138lbs ?History: 61 year old female, complains of bilateral lower extremity paresthesia gait abnormality since COVID infection October 2022, left worse than right ? ?Summary of the test: ? ?Nerve conduction study: ?Bilateral sural, superficial peroneal, left median and ulnar sensory responses were normal. ? ?Bilateral tibial, peroneal to EDB, left median and ulnar motor responses were normal.  F-wave latency were all within normal limit. ? ?Electromyography:  ?Selected needle examinations was performed at bilateral lower extremity muscles, bilateral lumbar paraspinals; left upper extremity muscles and left cervical paraspinal muscles. ? ?Conclusion: ?This is a normal study.  There is no electrodiagnostic evidence of large fiber peripheral neuropathy, bilateral lumbosacral radiculopathy, left cervical radiculopathy. ? ? ? ?------------------------------- ?Marcial Pacas M.D. PhD ? ?[facility] ?[address], [address] ?[place], [place] [address] ?Tel: [phone] ?Fax: [phone] ? ?Verbal informed consent was obtained from the patient, patient was informed of potential risk of procedure, including bruising, bleeding, hematoma formation, infection, muscle weakness, muscle pain, numbness, among others. ?   ? ?   ?[place] ?   ?Nerve / Sites Muscle Latency Ref. Amplitude Ref. Rel Amp Segments Distance Velocity Ref. Area  ?  ms ms mV mV %  cm m/s m/s mVms  ?L Median - APB  ?   Wrist APB 3.0 ?4.4 12.9 ?4.0 100 Wrist - APB 7   38.0  ?   Upper arm APB 7.2  11.9  92.5 Upper arm - Wrist 22 52 ?49 34.0  ?L Ulnar - ADM  ?   Wrist ADM 2.5 ?3.3 8.9 ?6.0 100 Wrist - ADM 7   30.2  ?   B.Elbow ADM 6.4  7.9  88.2 B.Elbow - Wrist 22 56 ?49 28.4  ?   A.Elbow ADM 8.6  8.4   107 A.Elbow - B.Elbow 12 55 ?49 31.7  ?L Peroneal - EDB  ?   Ankle EDB 4.5 ?6.5 6.0 ?2.0 100 Ankle - EDB 9   18.4  ?   Fib head EDB 10.0  6.6  111 Fib head - Ankle 30 54 ?44 20.9  ?   Pop fossa EDB 12.0  6.5  98.1 Pop fossa - Fib head 10 52 ?44 20.8  ?       Pop fossa - Ankle      ?R Peroneal - EDB  ?   Ankle EDB 3.9 ?6.5 8.2 ?2.0 100 Ankle - EDB 9   29.6  ?   Fib head EDB 8.9  7.4  89.9 Fib head - Ankle 30 60 ?44 27.3  ?   Pop fossa EDB 10.6  5.6  75.2 Pop fossa - Fib head 10 59 ?44 19.7  ?       Pop fossa - Ankle      ?L Tibial - AH  ?   Ankle AH 4.8 ?5.8 7.1 ?4.0 100 Ankle - AH 9   30.9  ?   Pop fossa AH 11.9  5.7  80 Pop fossa - Ankle 41 58 ?41 29.4  ?R Tibial - AH  ?   Ankle AH 3.8 ?5.8 8.3 ?4.0 100 Ankle - AH 9   28.7  ?   Pop fossa AH 12.1  7.6  92.1 Pop fossa - Ankle 40 48 ?  41 26.0  ?               ?[place] ?   ?Nerve / Sites Rec. Site Peak Lat Ref.  Amp Ref. Segments Distance  ?  ms ms ?V ?V  cm  ?L Sural - Ankle (Calf)  ?   Calf Ankle 4.0 ?4.4 9 ?6 Calf - Ankle 14  ?R Sural - Ankle (Calf)  ?   Calf Ankle 4.4 ?4.4 6 ?6 Calf - Ankle 14  ?L Superficial peroneal - Ankle  ?   Lat leg Ankle 3.5 ?4.4 8 ?6 Lat leg - Ankle 14  ?R Superficial peroneal - Ankle  ?   Lat leg Ankle 3.7 ?4.4 6 ?6 Lat leg - Ankle 14  ?L Median - Orthodromic (Dig II, Mid palm)  ?   Dig II Wrist 2.7 ?3.4 17 ?10 Dig II - Wrist 13  ?L Ulnar - Orthodromic, (Dig V, Mid palm)  ?   Dig V Wrist 2.6 ?3.1 14 ?5 Dig V - Wrist 11  ?               ?F  Wave ?   ?Nerve F Lat Ref.  ? ms ms  ?L Tibial - AH 54.0 ?56.0  ?R Tibial - AH 54.2 ?56.0  ?L Ulnar - ADM 25.8 ?32.0  ?         ?EMG Summary Table   ? Spontaneous MUAP Recruitment  ?Muscle IA Fib PSW Fasc Other Amp Dur. Poly Pattern  ?L. First dorsal interosseous Normal None None None _______ Normal Normal Normal Normal  ?L. Pronator teres Normal None None None _______ Normal Normal Normal Normal  ?L. Brachioradialis Normal None None None _______ Normal Normal Normal Normal  ?L. Triceps brachii Normal None  None None _______ Normal Normal Normal Normal  ?L. Deltoid Normal None None None _______ Normal Normal Normal Normal  ?L. Biceps brachii Normal None None None _______ Normal Normal Normal Normal  ?L. Vastus lateralis Normal None None None _______ Normal Normal Normal Normal  ?L. Tibialis posterior Normal None None None _______ Normal Normal Normal Normal  ?L. Tibialis anterior Normal None None None _______ Normal Normal Normal Normal  ?L. Peroneus longus Normal None None None _______ Normal Normal Normal Normal  ?L. Gastrocnemius (Medial head) Normal None None None _______ Normal Normal Normal Normal  ?R. Tibialis posterior Normal None None None _______ Normal Normal Normal Normal  ?R. Tibialis anterior Normal None None None _______ Normal Normal Normal Normal  ?R. Peroneus longus Normal None None None _______ Normal Normal Normal Normal  ?R. Gastrocnemius (Medial head) Normal None None None _______ Normal Normal Normal Normal  ?R. Lumbar paraspinals (mid) Normal None None None _______ Normal Normal Normal Normal  ?R. Lumbar paraspinals (low) Normal None None None _______ Normal Normal Normal Normal  ?L. Lumbar paraspinals (mid) Normal None None None _______ Normal Normal Normal Normal  ?L. Lumbar paraspinals (low) Normal None None None _______ Normal Normal Normal Normal  ?L. Cervical paraspinals Normal None None None _______ Normal Normal Normal Normal  ? ?  ?

## 2021-05-26 NOTE — Telephone Encounter (Signed)
Please get up MRI record from [facility]  ?

## 2021-05-27 NOTE — Telephone Encounter (Signed)
Received MRI lumbar results from [facility]. Exam date of 03/11/2021.  ?

## 2021-06-18 NOTE — Telephone Encounter (Signed)
Patient has r/s her MRI for 06/25/21.  ?

## 2021-06-25 ENCOUNTER — Other Ambulatory Visit: Payer: 59
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
## 2021-07-09 ENCOUNTER — Encounter: Payer: No Typology Code available for payment source | Admitting: Neurology

## 2021-08-13 DIAGNOSIS — M62830 Muscle spasm of back: Secondary | ICD-10-CM | POA: Insufficient documentation

## 2022-02-16 DIAGNOSIS — G894 Chronic pain syndrome: Secondary | ICD-10-CM | POA: Insufficient documentation

## 2022-02-16 DIAGNOSIS — M5030 Other cervical disc degeneration, unspecified cervical region: Secondary | ICD-10-CM | POA: Insufficient documentation

## 2022-02-16 DIAGNOSIS — M542 Cervicalgia: Secondary | ICD-10-CM | POA: Insufficient documentation

## 2022-03-09 DIAGNOSIS — M5412 Radiculopathy, cervical region: Secondary | ICD-10-CM | POA: Insufficient documentation

## 2022-05-06 ENCOUNTER — Ambulatory Visit (HOSPITAL_COMMUNITY): Payer: No Typology Code available for payment source

## 2022-06-03 DIAGNOSIS — M7918 Myalgia, other site: Secondary | ICD-10-CM | POA: Insufficient documentation

## 2022-07-08 ENCOUNTER — Encounter: Payer: 59 | Admitting: Internal Medicine

## 2022-07-21 ENCOUNTER — Encounter: Payer: 59 | Admitting: Internal Medicine

## 2022-11-07 IMAGING — CT CT CHEST LUNG CANCER SCREENING LOW DOSE W/O CM
2 of 4 series · 15 of 36 positions shown, 18 images · non-contrast
Comparison: None.

CLINICAL DATA: 60-year-old asymptomatic female former smoker with
20 pack-year smoking history, quit smoking November 2019.



[Series 3: lung thins 1.0 · axial · 0.62mm/px · z∈[-389,-69]mm · 12 of 352 slices shown, 15 images]
[im 16/352  mediastinal]
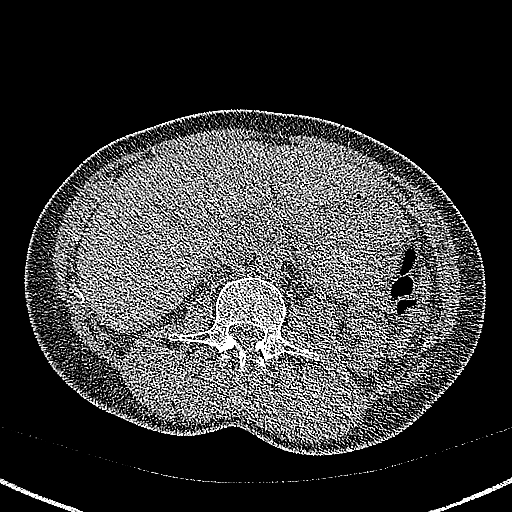
[im 16/352  lung]
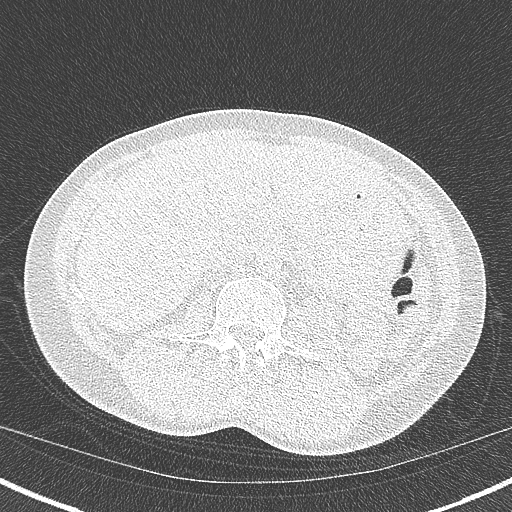
[im 48/352  lung]
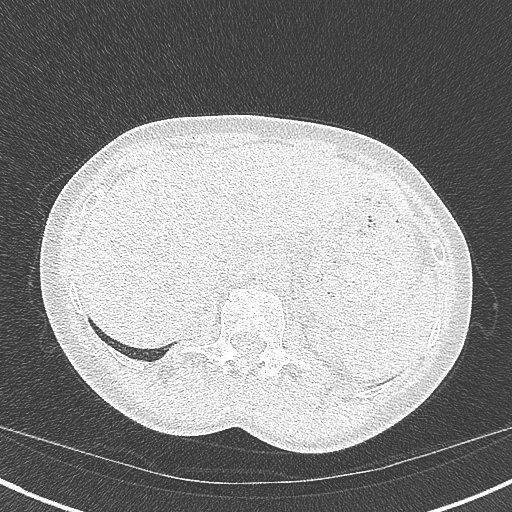
[im 80/352  lung]
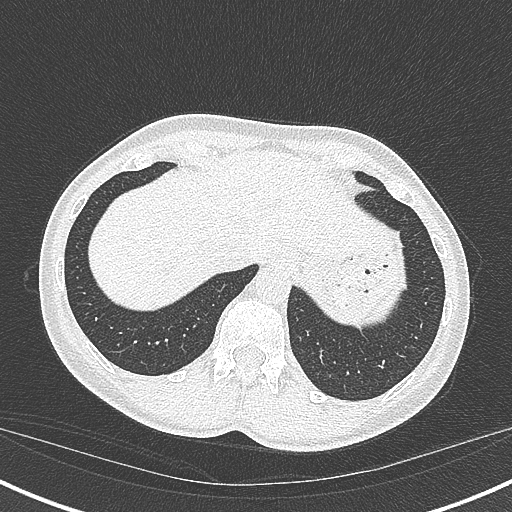
[im 112/352  lung]
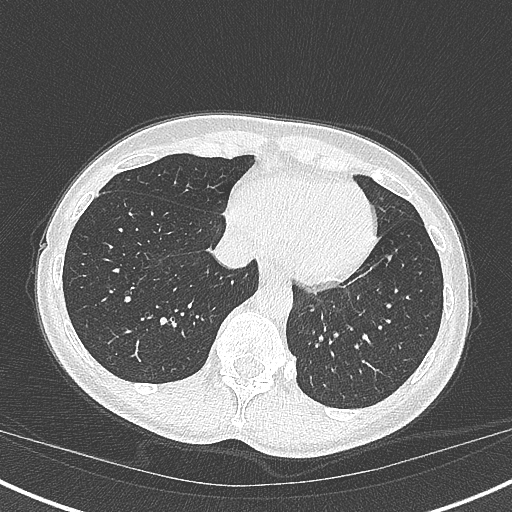
[im 128/352  mediastinal]
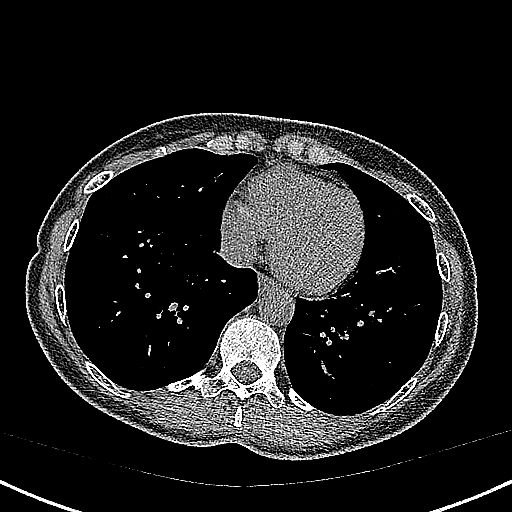
[im 128/352  lung]
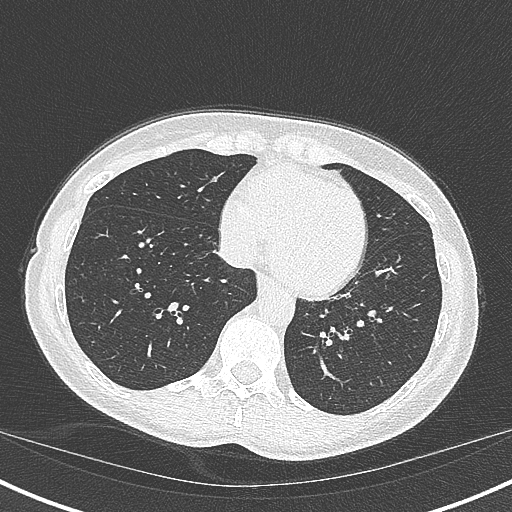
[im 160/352  lung]
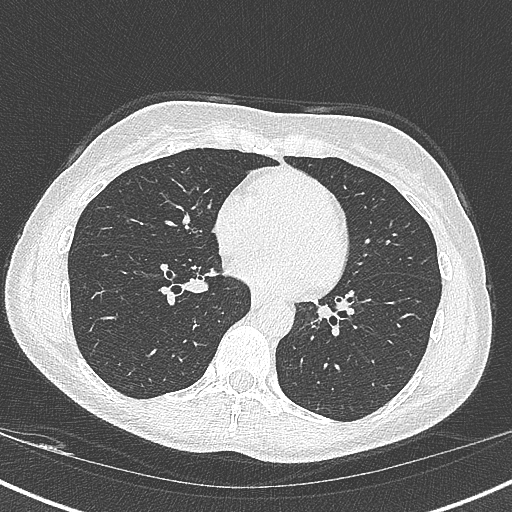
[im 192/352  lung]
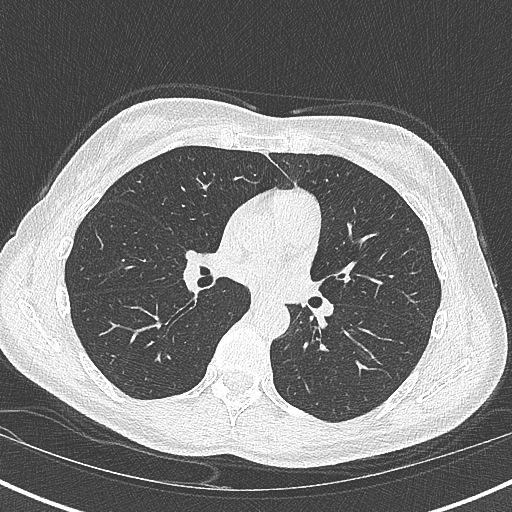
[im 224/352  lung]
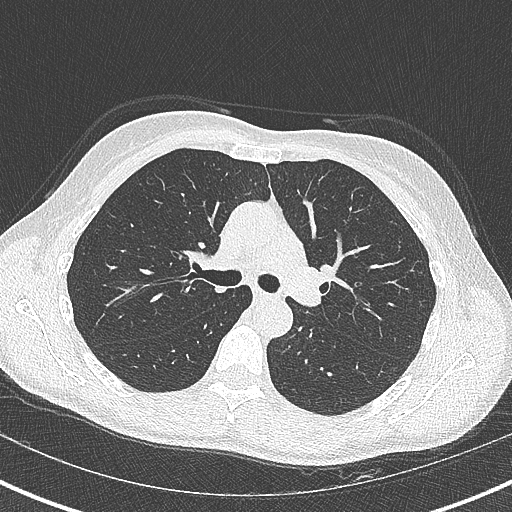
[im 240/352  mediastinal]
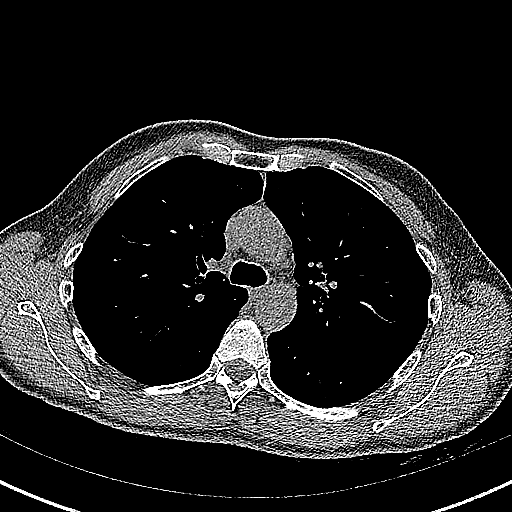
[im 240/352  lung]
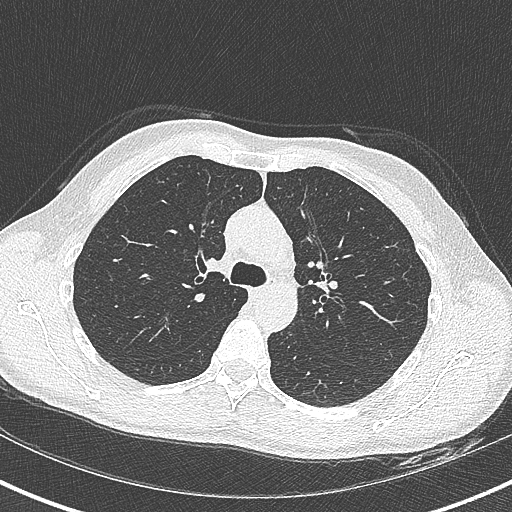
[im 272/352  lung]
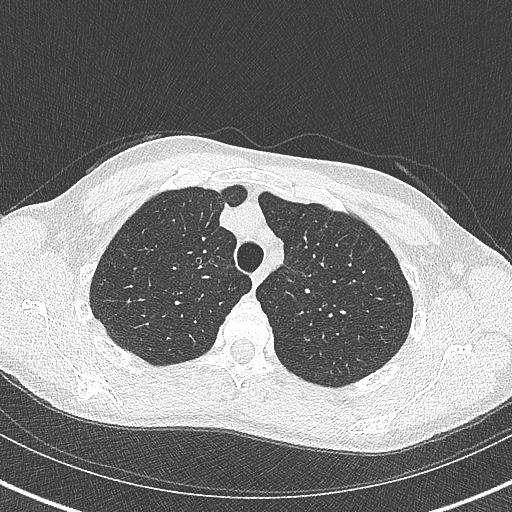
[im 304/352  lung]
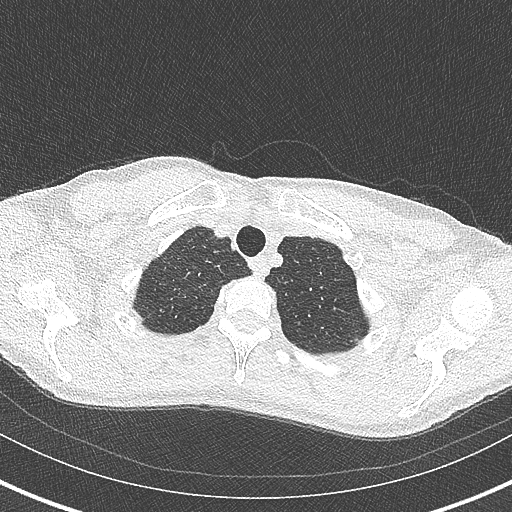
[im 336/352  lung]
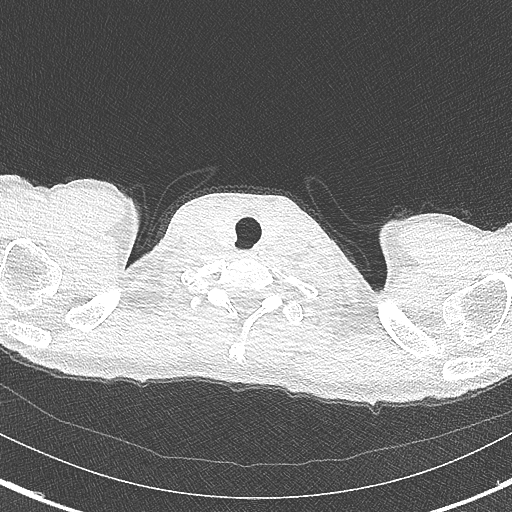

[Series 5: coronal · coronal · 0.54mm/px · 3 of 101 slices shown]
[im 21/101  lung]
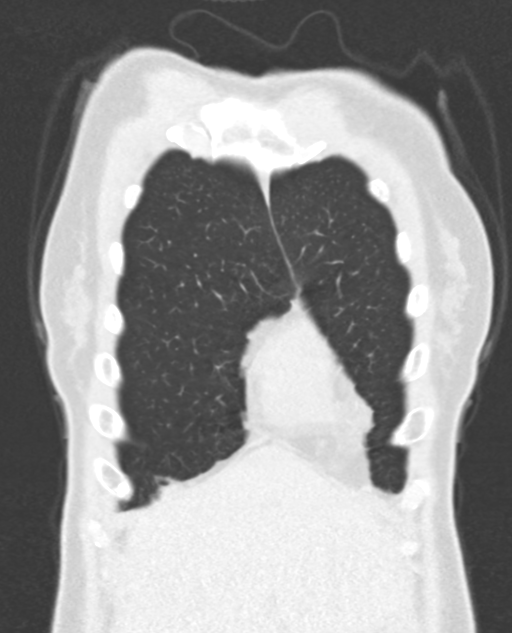
[im 41/101  lung]
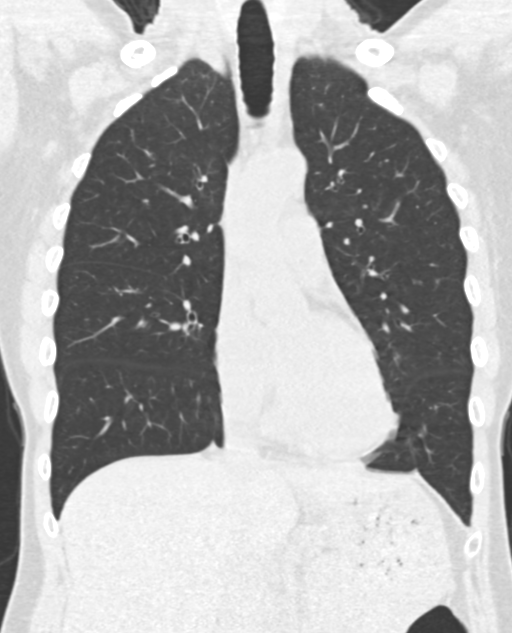
[im 61/101  lung]
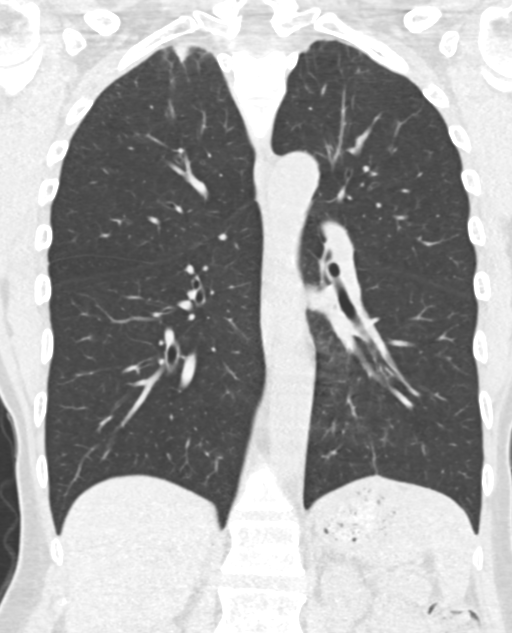

[15 of 36 positions shown; findings below may reference images not displayed]

FINDINGS: Cardiovascular: Normal heart size. No significant pericardial
effusion/thickening. Minimally atherosclerotic nonaneurysmal
thoracic aorta. Normal caliber pulmonary arteries.

Mediastinum/Nodes: No discrete thyroid nodules. Unremarkable
esophagus. No pathologically enlarged axillary, mediastinal or hilar
lymph nodes, noting limited sensitivity for the detection of hilar
adenopathy on this noncontrast study.

Lungs/Pleura: No pneumothorax. No pleural effusion. Mild paraseptal
and centrilobular emphysema with diffuse bronchial wall thickening.
No acute consolidative airspace disease or lung masses. Tiny
anterior right lower lobe pulmonary nodule along the major fissure
measuring 1.8 mm in volume derived mean diameter (series 3/image
182). No additional significant pulmonary nodules.

Upper abdomen: Simple 1.2 cm right liver dome cyst.

Musculoskeletal: No aggressive appearing focal osseous lesions.
Minimal thoracic spondylosis.
IMPRESSION: Lung-RADS 2, benign appearance or behavior. Continue annual
screening with low-dose chest CT without contrast in 12 months.

Aortic Atherosclerosis (5O81G-LGI.I) and Emphysema (5O81G-1EE.D).

## 2023-01-05 ENCOUNTER — Ambulatory Visit: Payer: 59 | Admitting: Internal Medicine

## 2023-03-11 ENCOUNTER — Encounter: Payer: Self-pay | Admitting: Acute Care

## 2023-03-25 ENCOUNTER — Emergency Department (HOSPITAL_COMMUNITY)
Admission: EM | Admit: 2023-03-25 | Discharge: 2023-03-25 | Disposition: A | Discharge status: Home / Self Care | Payer: Self-pay | Attending: Emergency Medicine | Admitting: Emergency Medicine

## 2023-03-25 ENCOUNTER — Other Ambulatory Visit: Payer: Self-pay

## 2023-03-25 ENCOUNTER — Encounter (HOSPITAL_COMMUNITY): Payer: Self-pay

## 2023-03-25 DIAGNOSIS — T5991XA Toxic effect of unspecified gases, fumes and vapors, accidental (unintentional), initial encounter: Secondary | ICD-10-CM | POA: Insufficient documentation

## 2023-03-25 DIAGNOSIS — R42 Dizziness and giddiness: Secondary | ICD-10-CM | POA: Insufficient documentation

## 2023-03-25 DIAGNOSIS — N3000 Acute cystitis without hematuria: Secondary | ICD-10-CM | POA: Insufficient documentation

## 2023-03-25 LAB — CBC
HCT: 36.9 % (ref 36.0–46.0)
Hemoglobin: 11.7 g/dL — ABNORMAL LOW (ref 12.0–15.0)
MCH: 25.8 pg — ABNORMAL LOW (ref 26.0–34.0)
MCHC: 31.7 g/dL (ref 30.0–36.0)
MCV: 81.5 fL (ref 80.0–100.0)
Platelets: 183 K/uL (ref 150–400)
RBC: 4.53 MIL/uL (ref 3.87–5.11)
RDW: 15.9 % — ABNORMAL HIGH (ref 11.5–15.5)
WBC: 6.6 K/uL (ref 4.0–10.5)
nRBC: 0 % (ref 0.0–0.2)

## 2023-03-25 LAB — BASIC METABOLIC PANEL WITH GFR
Anion gap: 10 (ref 5–15)
BUN: 14 mg/dL (ref 6–20)
CO2: 21 mmol/L — ABNORMAL LOW (ref 22–32)
Calcium: 9 mg/dL (ref 8.9–10.3)
Chloride: 109 mmol/L (ref 98–111)
Creatinine, Ser: 0.58 mg/dL (ref 0.44–1.00)
GFR, Estimated: >60 mL/min
Glucose, Bld: 121 mg/dL — ABNORMAL HIGH (ref 70–99)
Potassium: 3.5 mmol/L (ref 3.5–5.1)
Sodium: 140 mmol/L (ref 135–145)

## 2023-03-25 LAB — URINALYSIS, ROUTINE W REFLEX MICROSCOPIC
Bilirubin Urine: NEGATIVE
Glucose, UA: NEGATIVE mg/dL
Hgb urine dipstick: NEGATIVE
Ketones, ur: NEGATIVE mg/dL
Nitrite: POSITIVE — AB
Protein, ur: NEGATIVE mg/dL
Specific Gravity, Urine: 1.019 (ref 1.005–1.030)
pH: 5 (ref 5.0–8.0)

## 2023-03-25 MED ORDER — SULFAMETHOXAZOLE-TRIMETHOPRIM 800-160 MG PO TABS
1.0000 | ORAL_TABLET | Freq: Once | ORAL | Status: AC
  Administered 2023-03-25: 1 via ORAL
  Filled 2023-03-25: qty 1

## 2023-03-25 MED ORDER — SULFAMETHOXAZOLE-TRIMETHOPRIM 800-160 MG PO TABS: 1.0000 | ORAL_TABLET | Freq: Two times a day (BID) | ORAL | 0 refills | Status: AC
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
## 2023-03-25 NOTE — Discharge Instructions (Signed)
As we discussed, your carbon monoxide level is a send out lab and will come back tomorrow.  You will be called if your carbon monoxide level is elevated  You have urinary tract infection so please take Bactrim twice daily for 5 days  See your doctor for follow-up  Return to ER if you have severe headaches or vomiting or trouble breathing

## 2023-03-26 LAB — CARBON MONOXIDE, BLOOD (PERFORMED AT REF LAB): Carbon Monoxide, Blood: 9.8 % — ABNORMAL HIGH (ref 0.0–3.6)

## 2023-03-28 LAB — URINE CULTURE: Culture: >=100000 — AB

## 2023-03-29 ENCOUNTER — Telehealth (HOSPITAL_BASED_OUTPATIENT_CLINIC_OR_DEPARTMENT_OTHER): Payer: Self-pay | Admitting: *Deleted

## 2023-03-29 NOTE — Telephone Encounter (Signed)
Post ED Visit - Positive Culture Follow-up  Culture report reviewed by antimicrobial stewardship pharmacist: [facility] []  Enzo Bi, Pharm.D. []  Celedonio Miyamoto, Pharm.D., BCPS AQ-ID []  Garvin Fila, Pharm.D., BCPS []  Georgina Pillion, [address].D., BCPS []  Hughes, [address].D., BCPS, AAHIVP []  Estella Husk, Pharm.D., BCPS, AAHIVP []  Lysle Pearl, PharmD, BCPS []  Phillips Climes, PharmD, BCPS []  Agapito Games, PharmD, BCPS []  Verlan Friends, PharmD []  Mervyn Gay, PharmD, BCPS []  Vinnie Level, PharmD  [facility] []  Len Childs, PharmD [x]  Greer Pickerel, PharmD []  Adalberto Cole, PharmD []  Perlie Gold, Rph []  Lonell Face) Jean Rosenthal, PharmD []  Earl Many, PharmD []  Junita Push, PharmD []  Dorna Leitz, PharmD []  Terrilee Files, PharmD []  Lynann Beaver, PharmD []  Keturah Barre, PharmD []  Loralee Pacas, PharmD    Positive urine culture Treated with Sulfamethoxazole-Trimethoprim, organism sensitive to the same and no further patient follow-up is required at this time.  Patsey Berthold 03/29/2023, 1:26 PM

## 2023-04-05 ENCOUNTER — Telehealth: Payer: Self-pay | Admitting: Neurology

## 2023-04-05 NOTE — Telephone Encounter (Signed)
Spoke with Andrea Snyder-pt's release was sent to [facility]. I called pt back and had to leave her a message. We no longer are able to do our own records releases unless it is a STAT request, her request was sent to our HIM dept for them to process, and they will send out records to her. Let her know to call [phone] to get a status update regarding her request.

## 2023-04-05 NOTE — Telephone Encounter (Signed)
Patient called in stated she came into office last week and signed a release of records and was told someone would give her a call back when they were ready to be picked up. Looks like a release was scanned in from [facility] HIM Dept, but to my knowledge they don't allow patients to pick up records, they will only send them out? I wasn't sure what to let her know I told her you would give her a call back

## 2023-12-24 ENCOUNTER — Other Ambulatory Visit: Payer: Self-pay

## 2023-12-24 ENCOUNTER — Emergency Department (HOSPITAL_COMMUNITY)
Admission: EM | Admit: 2023-12-24 | Discharge: 2023-12-24 | Discharge status: Against Medical Advice | Payer: Self-pay | Source: Ambulatory Visit | Attending: Emergency Medicine | Admitting: Emergency Medicine

## 2023-12-24 DIAGNOSIS — K122 Cellulitis and abscess of mouth: Secondary | ICD-10-CM | POA: Insufficient documentation

## 2023-12-24 DIAGNOSIS — Z5329 Procedure and treatment not carried out because of patient's decision for other reasons: Secondary | ICD-10-CM | POA: Insufficient documentation

## 2023-12-24 MED ORDER — CLINDAMYCIN PHOSPHATE 600 MG/50ML IV SOLN: 600.0000 mg | Freq: Once | INTRAVENOUS | Status: DC

## 2023-12-24 MED ORDER — FENTANYL CITRATE (PF) 50 MCG/ML IJ SOSY: 25.0000 ug | PREFILLED_SYRINGE | Freq: Once | INTRAMUSCULAR | Status: DC

## 2023-12-24 MED ORDER — CLINDAMYCIN HCL 150 MG PO CAPS
150.0000 mg | ORAL_CAPSULE | Freq: Four times a day (QID) | ORAL | 0 refills | Status: AC
Start: 2023-12-24 — End: ?
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
## 2023-12-24 NOTE — ED Triage Notes (Signed)
 Patient c/o dental pain and swelling x 3 days. Patient noticed increase swelling today. Patient denies fever.
